# Patient Record
Sex: Female | Born: 1975
Health system: Southern US, Community
[De-identification: ages and names within clinical notes are randomized; demographics above are authoritative.]

## PROBLEM LIST (undated history)

## (undated) ENCOUNTER — Ambulatory Visit (HOSPITAL_COMMUNITY): Discharge status: Absent without leave | Payer: Medicaid Other

## (undated) ENCOUNTER — Inpatient Hospital Stay (HOSPITAL_COMMUNITY): Payer: Self-pay

## (undated) DIAGNOSIS — Z8619 Personal history of other infectious and parasitic diseases: Secondary | ICD-10-CM

## (undated) HISTORY — PX: OTHER SURGICAL HISTORY: SHX169

## (undated) HISTORY — PX: TUBAL LIGATION: SHX77

## (undated) HISTORY — DX: Personal history of other infectious and parasitic diseases: Z86.19

---

## 2005-12-07 ENCOUNTER — Ambulatory Visit: Payer: Self-pay | Admitting: Nurse Practitioner

## 2005-12-09 ENCOUNTER — Ambulatory Visit: Payer: Self-pay | Admitting: *Deleted

## 2005-12-13 ENCOUNTER — Ambulatory Visit (HOSPITAL_COMMUNITY): Admission: RE | Admit: 2005-12-13 | Discharge: 2005-12-13 | Payer: Self-pay | Admitting: Nurse Practitioner

## 2006-03-06 ENCOUNTER — Ambulatory Visit: Payer: Self-pay | Admitting: Nurse Practitioner

## 2006-05-31 ENCOUNTER — Ambulatory Visit: Payer: Self-pay | Admitting: Obstetrics and Gynecology

## 2006-11-22 ENCOUNTER — Encounter (INDEPENDENT_AMBULATORY_CARE_PROVIDER_SITE_OTHER): Payer: Self-pay | Admitting: *Deleted

## 2007-06-13 ENCOUNTER — Encounter (INDEPENDENT_AMBULATORY_CARE_PROVIDER_SITE_OTHER): Payer: Self-pay | Admitting: Nurse Practitioner

## 2007-06-13 ENCOUNTER — Ambulatory Visit: Payer: Self-pay | Admitting: Family Medicine

## 2007-06-13 LAB — CONVERTED CEMR LAB
Albumin: 4.4 g/dL (ref 3.5–5.2)
CO2: 27 meq/L (ref 19–32)
Calcium: 8.7 mg/dL (ref 8.4–10.5)
Chloride: 101 meq/L (ref 96–112)
Eosinophils Relative: 1 % (ref 0–5)
Glucose, Bld: 116 mg/dL — ABNORMAL HIGH (ref 70–99)
HCT: 42 % (ref 36.0–46.0)
Lymphocytes Relative: 36 % (ref 12–46)
Lymphs Abs: 2.3 10*3/uL (ref 0.7–4.0)
Platelets: 294 10*3/uL (ref 150–400)
Potassium: 3.8 meq/L (ref 3.5–5.3)
Sodium: 140 meq/L (ref 135–145)
TSH: 0.934 microintl units/mL (ref 0.350–5.50)
Total Bilirubin: 0.3 mg/dL (ref 0.3–1.2)
Total Protein: 8.2 g/dL (ref 6.0–8.3)
WBC: 6.5 10*3/uL (ref 4.0–10.5)

## 2007-07-03 ENCOUNTER — Ambulatory Visit: Payer: Self-pay | Admitting: Internal Medicine

## 2007-10-30 ENCOUNTER — Ambulatory Visit: Payer: Self-pay | Admitting: Internal Medicine

## 2009-04-07 ENCOUNTER — Inpatient Hospital Stay (HOSPITAL_COMMUNITY): Admission: AD | Admit: 2009-04-07 | Discharge: 2009-04-07 | Payer: Self-pay | Admitting: Obstetrics and Gynecology

## 2009-08-26 ENCOUNTER — Inpatient Hospital Stay (HOSPITAL_COMMUNITY): Admission: AD | Admit: 2009-08-26 | Discharge: 2009-08-30 | Payer: Self-pay | Admitting: Obstetrics and Gynecology

## 2009-10-01 ENCOUNTER — Inpatient Hospital Stay (HOSPITAL_COMMUNITY): Admission: RE | Admit: 2009-10-01 | Discharge: 2009-10-04 | Payer: Self-pay | Admitting: Obstetrics and Gynecology

## 2009-10-01 ENCOUNTER — Encounter (INDEPENDENT_AMBULATORY_CARE_PROVIDER_SITE_OTHER): Payer: Self-pay | Admitting: Obstetrics and Gynecology

## 2009-11-25 ENCOUNTER — Emergency Department (HOSPITAL_COMMUNITY): Admission: EM | Admit: 2009-11-25 | Discharge: 2009-11-25 | Payer: Self-pay | Admitting: Emergency Medicine

## 2010-03-27 ENCOUNTER — Encounter: Payer: Self-pay | Admitting: Nurse Practitioner

## 2010-03-28 ENCOUNTER — Encounter: Payer: Self-pay | Admitting: Obstetrics and Gynecology

## 2010-05-22 LAB — CBC
HCT: 39.5 % (ref 36.0–46.0)
MCH: 29.1 pg (ref 26.0–34.0)
MCH: 29.9 pg (ref 26.0–34.0)
MCHC: 34.1 g/dL (ref 30.0–36.0)
MCV: 86.6 fL (ref 78.0–100.0)
MCV: 87.7 fL (ref 78.0–100.0)
Platelets: 193 10*3/uL (ref 150–400)
Platelets: 264 10*3/uL (ref 150–400)
RDW: 15.6 % — ABNORMAL HIGH (ref 11.5–15.5)
RDW: 16.3 % — ABNORMAL HIGH (ref 11.5–15.5)
WBC: 7.9 10*3/uL (ref 4.0–10.5)

## 2010-05-22 LAB — BASIC METABOLIC PANEL
BUN: 4 mg/dL — ABNORMAL LOW (ref 6–23)
Calcium: 9 mg/dL (ref 8.4–10.5)
Creatinine, Ser: 0.57 mg/dL (ref 0.4–1.2)
GFR calc non Af Amer: >60 mL/min (ref >60)
Glucose, Bld: 79 mg/dL (ref 70–99)

## 2010-05-22 LAB — GLUCOSE, CAPILLARY
Glucose-Capillary: 105 mg/dL — ABNORMAL HIGH (ref 70–99)
Glucose-Capillary: 118 mg/dL — ABNORMAL HIGH (ref 70–99)
Glucose-Capillary: 119 mg/dL — ABNORMAL HIGH (ref 70–99)
Glucose-Capillary: 140 mg/dL — ABNORMAL HIGH (ref 70–99)
Glucose-Capillary: 62 mg/dL — ABNORMAL LOW (ref 70–99)
Glucose-Capillary: 64 mg/dL — ABNORMAL LOW (ref 70–99)

## 2010-05-23 LAB — GLUCOSE, CAPILLARY
Glucose-Capillary: 103 mg/dL — ABNORMAL HIGH (ref 70–99)
Glucose-Capillary: 106 mg/dL — ABNORMAL HIGH (ref 70–99)
Glucose-Capillary: 106 mg/dL — ABNORMAL HIGH (ref 70–99)
Glucose-Capillary: 112 mg/dL — ABNORMAL HIGH (ref 70–99)
Glucose-Capillary: 118 mg/dL — ABNORMAL HIGH (ref 70–99)
Glucose-Capillary: 141 mg/dL — ABNORMAL HIGH (ref 70–99)
Glucose-Capillary: 159 mg/dL — ABNORMAL HIGH (ref 70–99)
Glucose-Capillary: 166 mg/dL — ABNORMAL HIGH (ref 70–99)
Glucose-Capillary: 89 mg/dL (ref 70–99)

## 2010-05-23 LAB — COMPREHENSIVE METABOLIC PANEL
ALT: 8 U/L (ref 0–35)
AST: 19 U/L (ref 0–37)
Albumin: 2.5 g/dL — ABNORMAL LOW (ref 3.5–5.2)
CO2: 21 mEq/L (ref 19–32)
Calcium: 8.8 mg/dL (ref 8.4–10.5)
Chloride: 108 mEq/L (ref 96–112)
GFR calc Af Amer: >60 mL/min (ref >60)
GFR calc non Af Amer: >60 mL/min (ref >60)
Sodium: 135 mEq/L (ref 135–145)
Total Bilirubin: 0.4 mg/dL (ref 0.3–1.2)

## 2010-05-23 LAB — CBC
Hemoglobin: 12.3 g/dL (ref 12.0–15.0)
MCH: 29.3 pg (ref 26.0–34.0)
Platelets: 282 10*3/uL (ref 150–400)
RBC: 4.1 MIL/uL (ref 3.87–5.11)
RBC: 4.22 MIL/uL (ref 3.87–5.11)
RDW: 14.9 % (ref 11.5–15.5)

## 2010-05-23 LAB — TYPE AND SCREEN: ABO/RH(D): B POS

## 2010-05-23 LAB — ABO/RH: ABO/RH(D): B POS

## 2010-05-27 LAB — URINALYSIS, ROUTINE W REFLEX MICROSCOPIC
Protein, ur: NEGATIVE mg/dL
Urobilinogen, UA: 0.2 mg/dL (ref 0.0–1.0)

## 2010-05-27 LAB — CBC
HCT: 38.2 % (ref 36.0–46.0)
MCHC: 33.6 g/dL (ref 30.0–36.0)
Platelets: 258 10*3/uL (ref 150–400)
RDW: 13.1 % (ref 11.5–15.5)

## 2010-05-27 LAB — GC/CHLAMYDIA PROBE AMP, GENITAL: Chlamydia, DNA Probe: NEGATIVE

## 2010-05-27 LAB — POCT PREGNANCY, URINE: Preg Test, Ur: POSITIVE

## 2010-05-27 LAB — WET PREP, GENITAL: Yeast Wet Prep HPF POC: NONE SEEN

## 2010-07-11 IMAGING — US US OB LIMITED
1 series · 14 of 28 positions shown · non-contrast
Comparison: none

OBSTETRICAL ULTRASOUND:
 This ultrasound exam was performed in the [HOSPITAL] Ultrasound Department.  The OB US report was generated in the AS system, and faxed to the ordering physician.  This report is also available in [HOSPITAL]?s AccessANYware and in [REDACTED] PACS.

[Series 1: us fetal bpp add'left gest · 38 acquisitions, 14 frames shown]
[im 2/38]
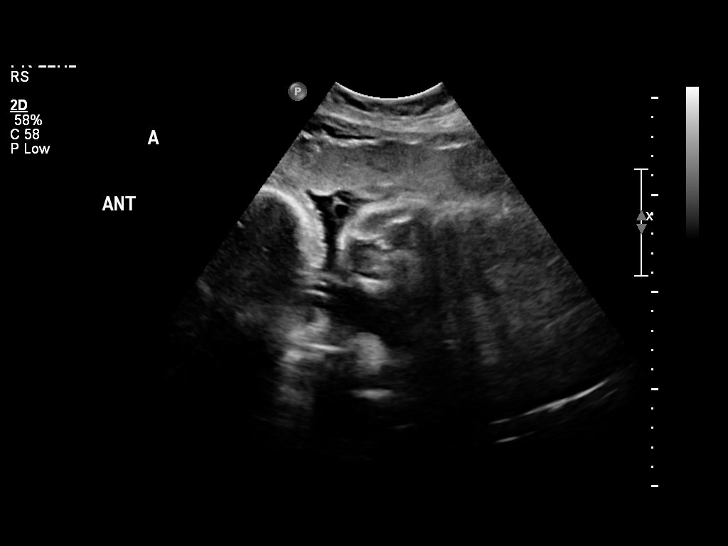
[im 5/38]
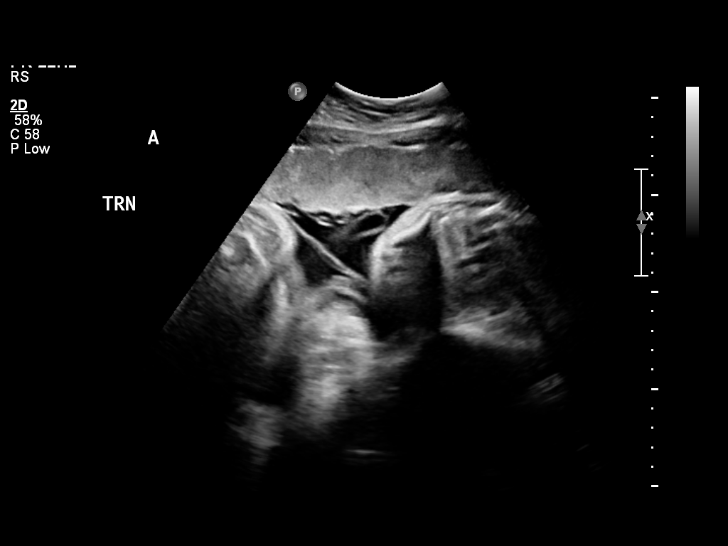
[im 7/38]
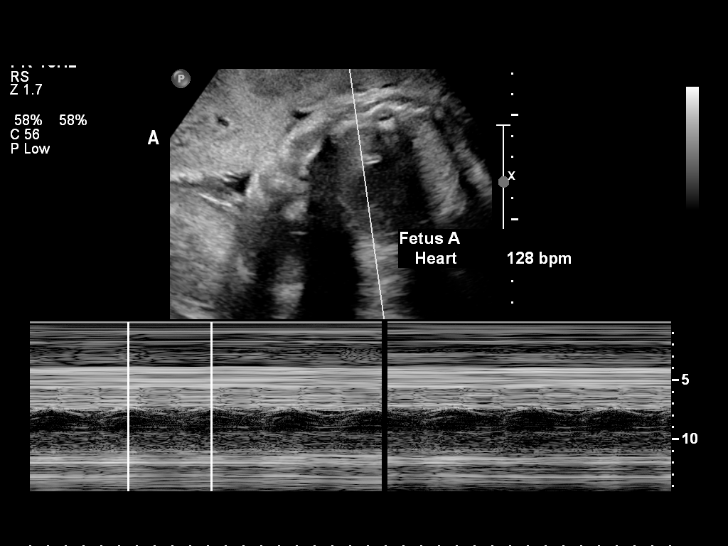
[im 10/38]
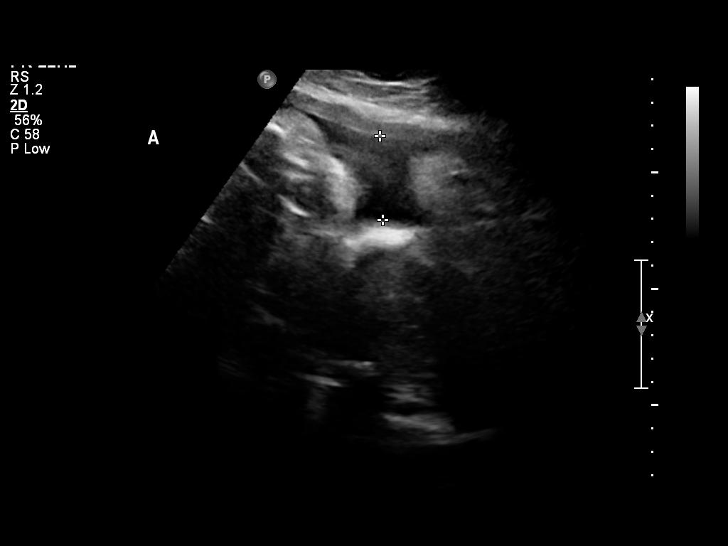
[im 13/38]
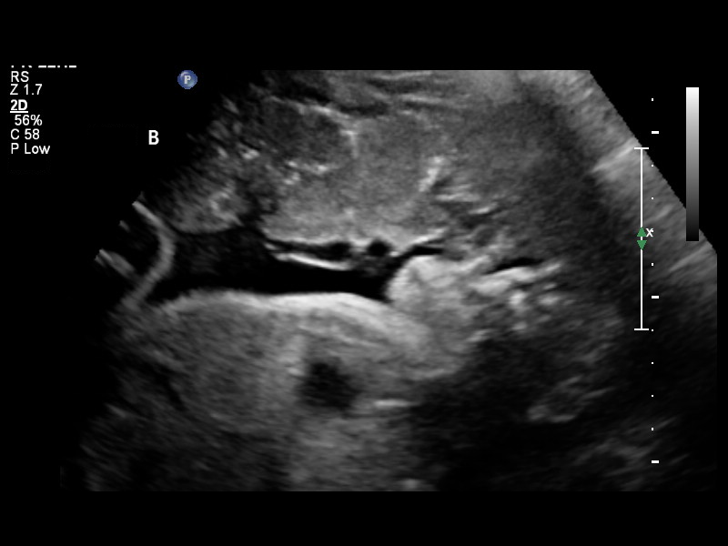
[im 16/38]
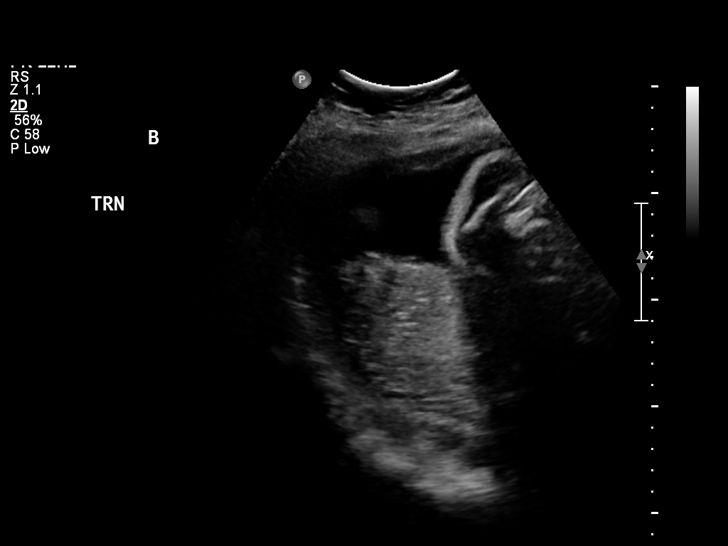
[im 18/38]
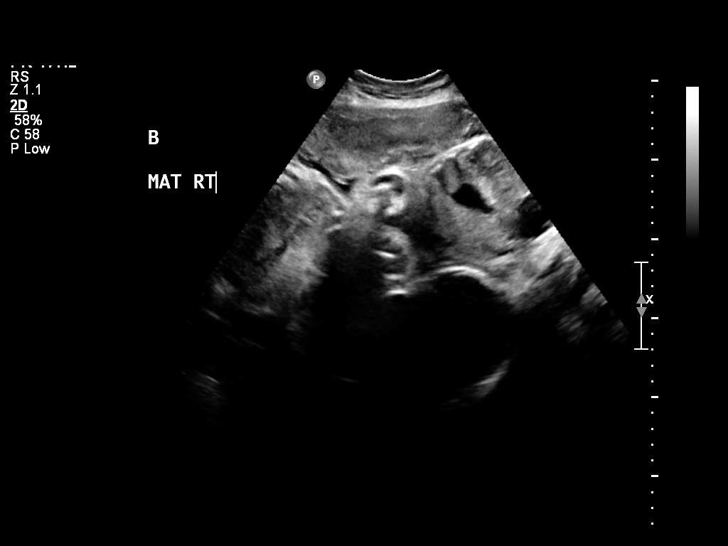
[im 21/38]
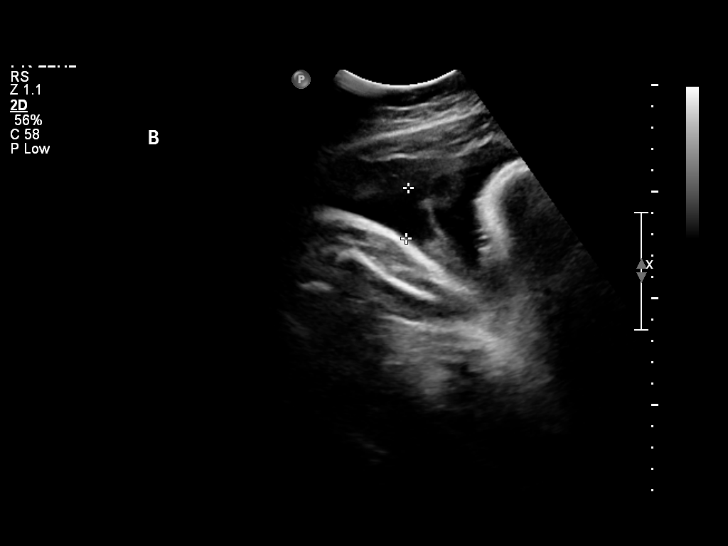
[im 24/38]
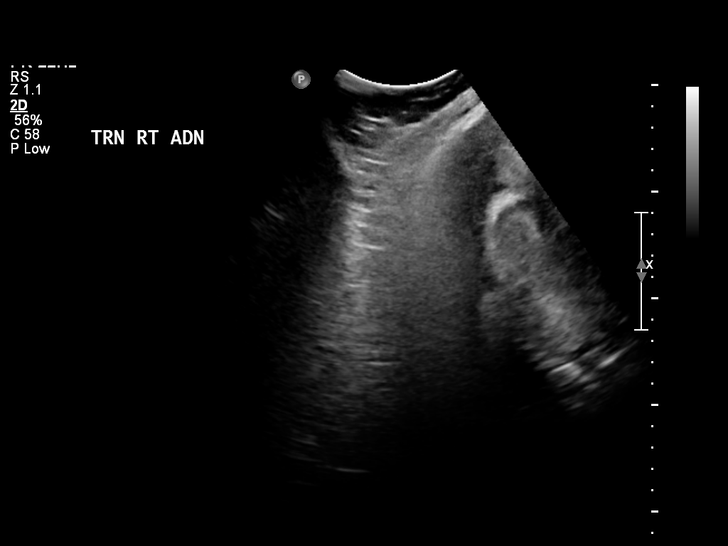
[im 27/38]
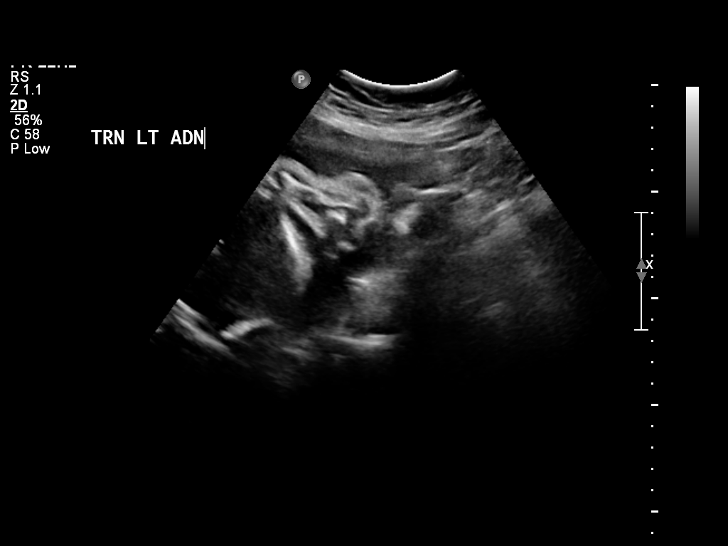
[im 29/38]
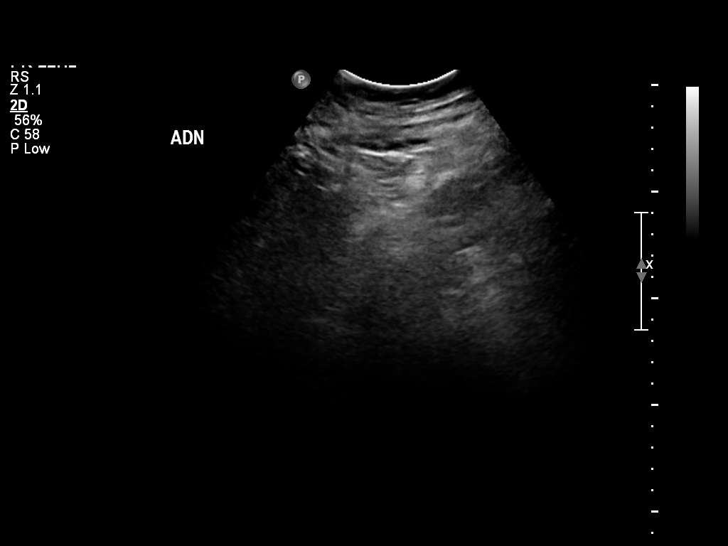
[im 32/38]
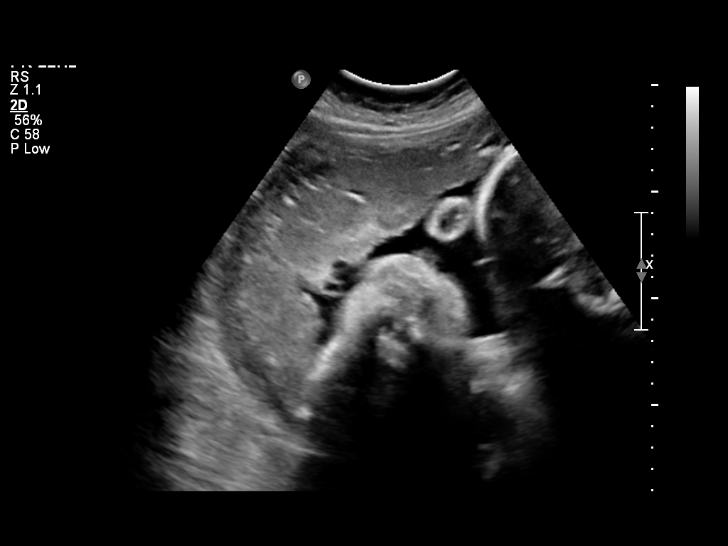
[im 35/38]
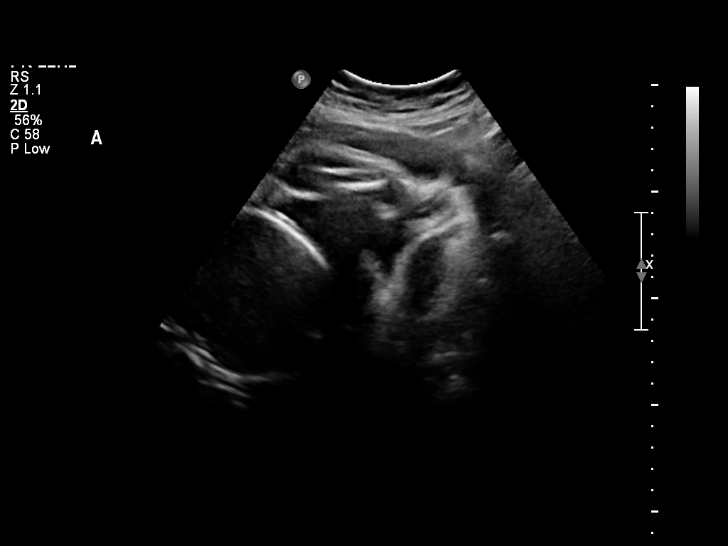
[im 38/38]
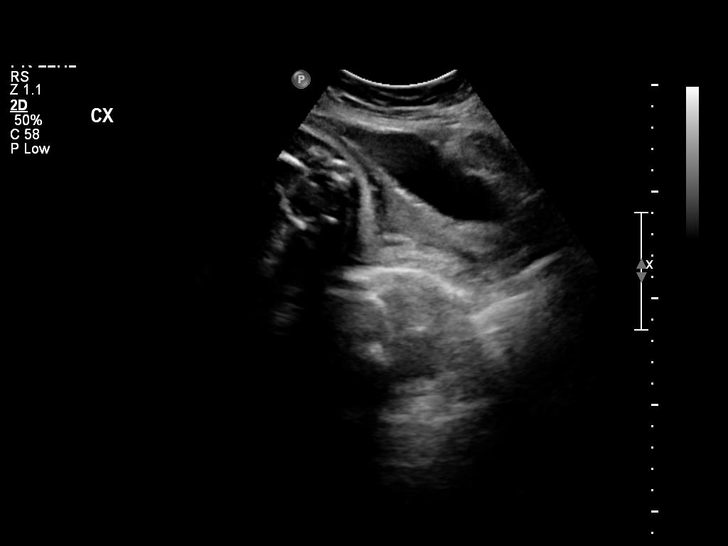

[14 of 28 positions shown; findings below may reference images not displayed]

IMPRESSION: See AS Obstetric US report.

## 2010-07-23 NOTE — Group Therapy Note (Signed)
[name], [name] NO.:  [id number]   MEDICAL RECORD NO.:  [id number]          PATIENT TYPE:  WOC   LOCATION:  [facility]                   FACILITY:  [facility]   PHYSICIAN:  Argentina Donovan, MD        DATE OF BIRTH:  [birth year]   DATE OF SERVICE:                                  CLINIC NOTE   HISTORY OF PRESENT ILLNESS:  The patient is a 35 year old Arabic-  speaking [place] female whose translation is by her husband who speaks  quite good English who comes in because of a menstrual irregularity, and  was referred by the clinic.  She is a gravida 5, para 3-0-2-3.  She had  her last baby 2 years ago, and breast fed up until approximately  September of 2007.  Since that time, she has bled on and off with the  longest time between bleeds of about 7 days.  Three months ago, she had  a pelvic ultrasound done here which was completely normal, and had exam  recently by her general practitioner who referred her over.  In  discussion with the patient, it sounds as if she probably has a hormonal  problem.  On examination, she has got a large, thickened neck where the  thyroid gland palpates and somewhat large.  She apparently had been on  thyroid medication prior to her last baby, but the doctor told her that  it had all gone back to normal.  In any case, we are going to follow  that up.  Also, having her cycle for 2 months on Loestrin FE.  A TSH, T4  and T3 have been drawn.  If the thyroid needs treatment, we will go  ahead and do that.  I have told her to return after 2 months if bleeding  is not controlled.   IMPRESSION:  1. Menometrorrhagia.  2. Enlarged thyroid.           ______________________________  Argentina Donovan, MD     PR/MEDQ  D:  05/31/2006  T:  05/31/2006  Job:  [id number]

## 2011-03-21 ENCOUNTER — Inpatient Hospital Stay (HOSPITAL_COMMUNITY)
Admission: AD | Admit: 2011-03-21 | Discharge: 2011-03-21 | Disposition: A | Discharge status: Home / Self Care | Payer: Self-pay | Source: Ambulatory Visit | Attending: Obstetrics & Gynecology | Admitting: Obstetrics & Gynecology

## 2011-03-21 ENCOUNTER — Encounter (HOSPITAL_COMMUNITY): Payer: Self-pay | Admitting: *Deleted

## 2011-03-21 DIAGNOSIS — IMO0001 Reserved for inherently not codable concepts without codable children: Secondary | ICD-10-CM

## 2011-03-21 DIAGNOSIS — R109 Unspecified abdominal pain: Secondary | ICD-10-CM | POA: Insufficient documentation

## 2011-03-21 DIAGNOSIS — Z7689 Persons encountering health services in other specified circumstances: Secondary | ICD-10-CM

## 2011-03-21 LAB — URINALYSIS, ROUTINE W REFLEX MICROSCOPIC
Glucose, UA: NEGATIVE mg/dL
Ketones, ur: NEGATIVE mg/dL
Leukocytes, UA: NEGATIVE
Specific Gravity, Urine: 1.01 (ref 1.005–1.030)
pH: 7 (ref 5.0–8.0)

## 2011-03-21 LAB — DIFFERENTIAL
Basophils Absolute: 0 10*3/uL (ref 0.0–0.1)
Basophils Relative: 0 % (ref 0–1)
Eosinophils Absolute: 0.1 10*3/uL (ref 0.0–0.7)
Monocytes Relative: 4 % (ref 3–12)
Neutro Abs: 4.2 10*3/uL (ref 1.7–7.7)
Neutrophils Relative %: 53 % (ref 43–77)

## 2011-03-21 LAB — CBC
Hemoglobin: 13.1 g/dL (ref 12.0–15.0)
MCH: 28.1 pg (ref 26.0–34.0)
MCHC: 32.1 g/dL (ref 30.0–36.0)
Platelets: 280 10*3/uL (ref 150–400)
RDW: 13.1 % (ref 11.5–15.5)

## 2011-03-21 LAB — URINE MICROSCOPIC-ADD ON

## 2011-03-21 MED ORDER — IBUPROFEN 200 MG PO TABS: 600.0000 mg | ORAL_TABLET | Freq: Four times a day (QID) | ORAL | Status: DC | PRN

## 2011-03-21 NOTE — Progress Notes (Signed)
Pt/spouse state bilateral lower abd pain since 0700 this am, has had small amount of bleeding, scant amt. Denies vaginal d/c changes. Does have burning/urgency with voiding. lmp-?beginning of December. Usually has regular monthly cycles.

## 2011-08-17 ENCOUNTER — Inpatient Hospital Stay (HOSPITAL_COMMUNITY)
Admission: AD | Admit: 2011-08-17 | Discharge: 2011-08-17 | Disposition: A | Discharge status: Home / Self Care | Payer: Medicaid Other | Source: Ambulatory Visit | Attending: Obstetrics & Gynecology | Admitting: Obstetrics & Gynecology

## 2011-08-17 ENCOUNTER — Encounter (HOSPITAL_COMMUNITY): Payer: Self-pay | Admitting: *Deleted

## 2011-08-17 DIAGNOSIS — Z3201 Encounter for pregnancy test, result positive: Secondary | ICD-10-CM | POA: Insufficient documentation

## 2011-08-17 DIAGNOSIS — N912 Amenorrhea, unspecified: Secondary | ICD-10-CM | POA: Insufficient documentation

## 2011-08-17 LAB — POCT PREGNANCY, URINE: Preg Test, Ur: POSITIVE — AB

## 2011-08-17 NOTE — MAU Note (Signed)
States no menstrual cycle x 2 months.

## 2011-08-17 NOTE — MAU Provider Note (Signed)
  History     CSN: [id number]  Arrival date and time: 08/17/11 1753   First Provider Initiated Contact with Patient 08/17/11 1920      Chief Complaint  Patient presents with  . Possible Pregnancy   HPI Briana Campbell 36 y.o. No menses x 2 months.  Pregnancy test is positive in MAU.  No pain.  NO bleeding.  No other problems.  Wanted to confirm the pregnancy.  OB History    Grav Para Term Preterm Abortions TAB SAB Ect Mult Living   6 6 5 1  0 0 0 0 2 5      Past Medical History  Diagnosis Date  . Gestational diabetes     Past Surgical History  Procedure Date  . Cesarean section     x 3    Family History  Problem Relation Age of Onset  . Anesthesia problems Neg Hx     History  Substance Use Topics  . Smoking status: Never Smoker   . Smokeless tobacco: Never Used  . Alcohol Use: No    Allergies: No Known Allergies  Prescriptions prior to admission  Medication Sig Dispense Refill  . DISCONTD: ibuprofen (ADVIL,MOTRIN) 200 MG tablet Take 3 tablets (600 mg total) by mouth every 6 (six) hours as needed for pain. Patient used this medication for a headache.  30 tablet  0    ROS Physical Exam   Blood pressure 93/55, pulse 75, temperature 98.8 F (37.1 C), temperature source Oral, resp. rate 20, height 5\' 1"  (1.549 m), weight 206 lb (93.441 kg), last menstrual period 06/06/2011.  Physical Exam  Nursing note and vitals reviewed. Constitutional: She is oriented to person, place, and time. She appears well-developed and well-nourished.  HENT:  Head: Normocephalic.  Eyes: EOM are normal.  Neck: Neck supple.  Musculoskeletal: Normal range of motion.  Neurological: She is alert and oriented to person, place, and time.  Skin: Skin is warm and dry.  Psychiatric: She has a normal mood and affect.    MAU Course  Procedures Results for orders placed during the hospital encounter of 08/17/11 (from the past 24 hour(s))  POCT PREGNANCY, URINE     Status: Abnormal   Collection Time   08/17/11  6:28 PM      Component Value Range   Preg Test, Ur POSITIVE (*) NEGATIVE     Assessment and Plan  Positive pregnancy test  Plan Begin prenatal care as soon as possible Pregnancy verification form given.  Bernell Haynie 08/17/2011, 7:25 PM

## 2011-08-17 NOTE — Discharge Instructions (Signed)
Your pregnancy test is positive.  No smoking, no drugs, no alcohol.  Take a prenatal vitamin one by mouth every day.  Eat small frequent snacks to avoid nausea.  Begin prenatal care as soon as possible. Stop taking ibuprofen. Take Tylenol 325 mg 2 tablets by mouth every 4 hours if needed for pain. Drink at least 8 8-oz glasses of water every day.   Prenatal Care [facility]    [facility]  [phone]     Phone: [phone]          [facility]                      [facility]  @[place]     Phone: [phone]  Phone: [phone]         [facility]     Phone: [phone]  Phone: [phone]           [facility] @ [place]                hone: [phone]  Phone: [phone]         [facility] [address]      Phone: [phone]  Phone: [phone]         [facility]                Phone: [phone]  [facility]   Phone:[phone]    [facility] [address] [place])          Phone: [phone] [facility]   Phone: [phone]       ________________________________________     To schedule your Maternity Eligibility Appointment, please call [phone].  When you arrive for your appointment you must bring the following items or information listed below.  Your appointment will be rescheduled if you do not have these items or are 15 minutes late. If currently receiving Medicaid, you MUST bring: 1. Medicaid Card 2. Social Security Card 3. Picture ID 4. Proof of Pregnancy 5. Verification of current address if the address on Medicaid card is incorrect "postmarked mail" If not receiving Medicaid, you MUST bring: 1. Social Security Card 2. Picture ID 3. Birth Certificate (if available) Passport or *Green Card 4. Proof of Pregnancy 5. Verification of  current address "postmarked mail" for each income presented. 6. Verification of insurance coverage, if any 7. Check stubs from each employer for the previous month (if unable to present check stub  for each week, we will accept check stub for the first and last week ill the same month.) If you can't locate check stubs, you must bring a letter from the employer(s) and it must have the following information on letterhead, typed, in English: o name of company o company telephone number o how long been with the company, if less than one month o how much person earns per hour o how many hours per week work o the gross pay the person earned for the previous month If you are 38 years old or less, you do not have to bring proof of income unless you work or live with the father of the baby and at that time we will need proof of income from you and/or the father of the baby. Green Card recipients are eligible for Medicaid for Pregnant Women (MPW)

## 2011-08-17 NOTE — MAU Note (Signed)
No cycle in 2 months. Came to see if preg. Denies pain or bleeding.

## 2011-09-18 DIAGNOSIS — Z98891 History of uterine scar from previous surgery: Secondary | ICD-10-CM | POA: Insufficient documentation

## 2011-09-21 ENCOUNTER — Encounter: Payer: Self-pay | Admitting: Obstetrics and Gynecology

## 2011-09-21 ENCOUNTER — Ambulatory Visit (INDEPENDENT_AMBULATORY_CARE_PROVIDER_SITE_OTHER): Payer: Medicaid Other | Admitting: Obstetrics and Gynecology

## 2011-09-21 VITALS — BP 90/68 | Wt 200.0 lb

## 2011-09-21 DIAGNOSIS — Z124 Encounter for screening for malignant neoplasm of cervix: Secondary | ICD-10-CM

## 2011-09-21 DIAGNOSIS — N90810 Female genital mutilation status, unspecified: Secondary | ICD-10-CM

## 2011-09-21 DIAGNOSIS — Z331 Pregnant state, incidental: Secondary | ICD-10-CM

## 2011-09-21 DIAGNOSIS — E669 Obesity, unspecified: Secondary | ICD-10-CM

## 2011-09-21 DIAGNOSIS — Z8632 Personal history of gestational diabetes: Secondary | ICD-10-CM | POA: Insufficient documentation

## 2011-09-21 DIAGNOSIS — O09529 Supervision of elderly multigravida, unspecified trimester: Secondary | ICD-10-CM | POA: Insufficient documentation

## 2011-09-21 DIAGNOSIS — Z1379 Encounter for other screening for genetic and chromosomal anomalies: Secondary | ICD-10-CM

## 2011-09-21 DIAGNOSIS — O3660X0 Maternal care for excessive fetal growth, unspecified trimester, not applicable or unspecified: Secondary | ICD-10-CM

## 2011-09-21 DIAGNOSIS — O0930 Supervision of pregnancy with insufficient antenatal care, unspecified trimester: Secondary | ICD-10-CM | POA: Insufficient documentation

## 2011-09-21 DIAGNOSIS — Z98891 History of uterine scar from previous surgery: Secondary | ICD-10-CM

## 2011-09-21 DIAGNOSIS — Z9889 Other specified postprocedural states: Secondary | ICD-10-CM

## 2011-09-21 LAB — POCT WET PREP (WET MOUNT): WBC, Wet Prep HPF POC: NEGATIVE

## 2011-09-21 LAB — POCT URINALYSIS DIPSTICK
Glucose, UA: NEGATIVE
Ketones, UA: NEGATIVE
Leukocytes, UA: NEGATIVE
Spec Grav, UA: 1.025
Urobilinogen, UA: NEGATIVE

## 2011-09-21 MED ORDER — FUSION PLUS PO CAPS: 1.0000 mg | ORAL_CAPSULE | ORAL | Status: DC

## 2011-09-21 NOTE — Progress Notes (Addendum)
Patient ID: Briana Campbell, female   DOB: 08/27/1975, 36 y.o.   MRN: [id number] Denies N,V eating all night feels good with fast per culture discussed.  [redacted]w[redacted]d  @IPILAPH @ OB History    Grav Para Term Preterm Abortions TAB SAB Ect Mult Living   7 5 5  0 1 0 1 0 2 5     Obstetric Comments   2011: GDM ON INSULIN; THIRD TRIMESTER BLEEDING HX PPD DEPRESSION X 1 MONTH WITH AFTER ONE OF ABOVE DELIVERIES     Past Medical History  Diagnosis Date  . Gestational diabetes   . Depression     PP   Past Surgical History  Procedure Date  . Female circumcision     SKIN TAG PRESENT  . Cesarean section     x 3   Family History: family history includes Asthma in her daughter and Hypertension in her mother.  There is no history of Anesthesia problems. Social History:  reports that she has never smoked. She has never used smokeless tobacco. She reports that she does not drink alcohol or use illicit drugs.    Blood pressure 90/68, weight 200 lb (90.719 kg), last menstrual period 06/06/2011.  Physical exam: Calm, no distress Alert, appropriate appearance for age. No acute distress HEENT Grossly normal Thyroid no masses, nodes or enlargement  lungs clear bilaterally, AP RRR, breasts bilaterally no masses, dimpling, drainage, abd soft, gravid, nt, bowel sounds active No edema to lower extremities EGBUS and perineum wnl, normal hair distribution Vagina pink moist Cervix LTC no cerical motion tenderness Scant white discharge appears adequate Fundal height 18 FHTS 150  Prenatal labs: ABO, Rh:   Antibody:   Rubella:   RPR:    HBsAg:    HIV:    GBS:     Assessment/Plan: [redacted]w[redacted]d S<D Gc/chl sent Wet prep: neg trich, hyphae, clue all neg Pap: to pathology [place]: f./o for dating Genetic screen: QUAD today per pt request  Needs early 1 gtt Collaboration with Dr. Normand Sloop. [facility], Briana Campbell 09/21/2011, 5:31 PM Lavera Guise, CNM

## 2011-09-21 NOTE — Progress Notes (Signed)
Pt's husband served as Equities trader. Family HX of multiple sets of twins. Is fasting for religious reasons. Is concerned about possible effects.

## 2011-09-21 NOTE — Progress Notes (Signed)
Pt unsure about genetic screening Blood work needed today  Pt needs PNV rf

## 2011-09-22 LAB — PRENATAL PANEL VII
Antibody Screen: NEGATIVE
Basophils Absolute: 0 10*3/uL (ref 0.0–0.1)
Eosinophils Relative: 1 % (ref 0–5)
Hepatitis B Surface Ag: NEGATIVE
Lymphocytes Relative: 32 % (ref 12–46)
Lymphs Abs: 2.4 10*3/uL (ref 0.7–4.0)
Neutrophils Relative %: 63 % (ref 43–77)
Platelets: 238 10*3/uL (ref 150–400)
RBC: 4.52 MIL/uL (ref 3.87–5.11)
RDW: 14.1 % (ref 11.5–15.5)
Rubella: 109.8 IU/mL — ABNORMAL HIGH
WBC: 7.6 10*3/uL (ref 4.0–10.5)

## 2011-09-23 LAB — CULTURE, OB URINE: Colony Count: 2000

## 2011-09-23 LAB — PAP IG, CT-NG, RFX HPV ASCU: GC Probe Amp: NEGATIVE

## 2011-09-26 ENCOUNTER — Other Ambulatory Visit: Payer: Self-pay | Admitting: Obstetrics and Gynecology

## 2011-09-26 ENCOUNTER — Ambulatory Visit (INDEPENDENT_AMBULATORY_CARE_PROVIDER_SITE_OTHER): Payer: Medicaid Other | Admitting: Obstetrics and Gynecology

## 2011-09-26 ENCOUNTER — Ambulatory Visit (INDEPENDENT_AMBULATORY_CARE_PROVIDER_SITE_OTHER): Payer: Medicaid Other

## 2011-09-26 ENCOUNTER — Other Ambulatory Visit: Payer: Self-pay

## 2011-09-26 VITALS — BP 90/62 | Wt 202.0 lb

## 2011-09-26 DIAGNOSIS — O3660X0 Maternal care for excessive fetal growth, unspecified trimester, not applicable or unspecified: Secondary | ICD-10-CM

## 2011-09-26 DIAGNOSIS — Z1389 Encounter for screening for other disorder: Secondary | ICD-10-CM

## 2011-09-26 DIAGNOSIS — IMO0001 Reserved for inherently not codable concepts without codable children: Secondary | ICD-10-CM

## 2011-09-26 DIAGNOSIS — O30009 Twin pregnancy, unspecified number of placenta and unspecified number of amniotic sacs, unspecified trimester: Secondary | ICD-10-CM

## 2011-09-26 DIAGNOSIS — O30809 Other specified multiple gestation, unspecified number of placenta and unspecified number of amniotic sacs, unspecified trimester: Secondary | ICD-10-CM

## 2011-09-26 LAB — US OB COMP ADDL GEST + 14 WK

## 2011-09-26 LAB — US OB COMP + 14 WK

## 2011-09-26 NOTE — Progress Notes (Addendum)
Ultrasound today twins consistent with dates cervix is 3.7 cm normal bilateral ovaries twins 8 and twin B both have normal fluid they appear to be dichorionic and diamnionic twins with a membrane that is visualized twin A appears to have an anterior placenta and twin B a posterior placenta U/S Reviewed H/o 3 csections Questions answered I rec not to fast Glucola at [place] as soon as she arrives while getting u/s Checking on genetic testing RTO 3wks for anatomy scan Declines genetic testing

## 2011-10-02 ENCOUNTER — Encounter (HOSPITAL_COMMUNITY): Payer: Self-pay | Admitting: *Deleted

## 2011-10-02 ENCOUNTER — Inpatient Hospital Stay (HOSPITAL_COMMUNITY)
Admission: AD | Admit: 2011-10-02 | Discharge: 2011-10-02 | Disposition: A | Discharge status: Home / Self Care | Payer: Medicaid Other | Source: Ambulatory Visit | Attending: Obstetrics and Gynecology | Admitting: Obstetrics and Gynecology

## 2011-10-02 DIAGNOSIS — J029 Acute pharyngitis, unspecified: Secondary | ICD-10-CM

## 2011-10-02 DIAGNOSIS — O30009 Twin pregnancy, unspecified number of placenta and unspecified number of amniotic sacs, unspecified trimester: Secondary | ICD-10-CM | POA: Insufficient documentation

## 2011-10-02 DIAGNOSIS — M542 Cervicalgia: Secondary | ICD-10-CM | POA: Insufficient documentation

## 2011-10-02 DIAGNOSIS — O99891 Other specified diseases and conditions complicating pregnancy: Secondary | ICD-10-CM | POA: Insufficient documentation

## 2011-10-02 MED ORDER — IBUPROFEN 600 MG PO TABS
600.0000 mg | ORAL_TABLET | Freq: Once | ORAL | Status: AC
  Administered 2011-10-02: 600 mg via ORAL
  Filled 2011-10-02: qty 1

## 2011-10-02 MED ORDER — ACETAMINOPHEN 325 MG PO TABS: 650.0000 mg | ORAL_TABLET | Freq: Four times a day (QID) | ORAL | Status: DC | PRN

## 2011-10-02 NOTE — Progress Notes (Signed)
Nona Smith CNM notified of pt's admission and status. Will see pt 

## 2011-10-02 NOTE — MAU Note (Signed)
Awoke Sat am and L portion of throat tender. Sharp pain when swallows but sore all the time. Alittle tender to touch.

## 2011-10-02 NOTE — MAU Provider Note (Signed)
History   CSN: [id number]  Arrival date and time: 10/02/11 0134   Pt contact 10/02/11 0245    Chief Complaint  Patient presents with  . Sore Throat   HPI Pt presents unannounced to MAU at 16w 6d  with c/o of sharp pain in the left side of her throat upon awakening this AM.  She states that the pain is increased when she swallows or coughs and that her neck is tender to touch.  Also c/o pain in the left side of her neck when she moves her head.  She states she did not have this pain last evening when she went to sleep.  She does report "allergy" symptoms such as red runny eyes and runny nose.  She denies any fever.  Her husband reports that he and one of their children also have the symptoms of "runny eyes and nose".  She denies nausea, vomiting or heartburn.  She has not treated her pain at present.  She denies any vaginal bleeding, uterine cramping or leakage of fluid.  She reports feeling some occas fetal movement.  Current pregnancy is twin gestation.  Visit interpreted by her husband.  OB History    Grav Para Term Preterm Abortions TAB SAB Ect Mult Living   7 5 5  0 1 0 1 0 2 5     Obstetric Comments   2011: GDM ON INSULIN; THIRD TRIMESTER BLEEDING HX PPD DEPRESSION X 1 MONTH WITH AFTER ONE OF ABOVE DELIVERIES      Past Medical History  Diagnosis Date  . Gestational diabetes   . Depression     PP    Past Surgical History  Procedure Date  . Female circumcision     SKIN TAG PRESENT  . Cesarean section     x 3    Family History  Problem Relation Age of Onset  . Anesthesia problems Neg Hx   . Hypertension Mother   . Asthma Daughter     History  Substance Use Topics  . Smoking status: Never Smoker   . Smokeless tobacco: Never Used  . Alcohol Use: No    Allergies: No Known Allergies  Prescriptions prior to admission  Medication Sig Dispense Refill  . loratadine (CLARITIN) 10 MG tablet Take 10 mg by mouth continuous as needed.      . Iron-FA-B  Cmp-C-Biot-Probiotic (FUSION PLUS) CAPS Take 1 mg by mouth 1 day or 1 dose.  30 capsule  11  . Prenatal Vit-Fe Fumarate-FA (PRENATAL MULTIVITAMIN) TABS Take 1 tablet by mouth daily.        Review of Systems  Constitutional: Negative.   HENT: Positive for congestion and sore throat.   Eyes: Positive for redness.  Respiratory: Negative.   Cardiovascular: Negative.   Gastrointestinal: Negative.   Genitourinary: Negative.   Musculoskeletal: Negative.   Skin: Negative.   Neurological: Negative.   Endo/Heme/Allergies: Negative.   Psychiatric/Behavioral: Negative.    Physical Exam   Blood pressure 110/64, pulse 82, temperature 98.1 F (36.7 C), resp. rate 20, height 5\' 1"  (1.549 m), weight 92.443 kg (203 lb 12.8 oz), last menstrual period 06/06/2011.  Physical Exam  Constitutional: She is oriented to person, place, and time. She appears well-developed and well-nourished.  HENT:  Head: Normocephalic and atraumatic.  Right Ear: External ear normal.  Left Ear: External ear normal.  Nose: Nose normal.  Mouth/Throat: Oropharynx is clear and moist. No oropharyngeal exudate.       TMs clear bilat.  Pharynx without redness or edema  noted.    Eyes: Pupils are equal, round, and reactive to light.       Bilateral conjunctivae with slight redness, no exudate noted.  Neck: Normal range of motion. Neck supple. No tracheal deviation present. No thyromegaly present.  Cardiovascular: Normal rate, regular rhythm and intact distal pulses.   Murmur heard.      Gr III/VI murmur rt sternal border  Respiratory: Effort normal and breath sounds normal.  GI: Soft. Bowel sounds are normal. There is no tenderness.       FHTs x 2 with doppler noted.  Genitourinary:       Deferred.    Musculoskeletal: Normal range of motion. She exhibits no edema.  Lymphadenopathy:    She has no cervical adenopathy.  Neurological: She is alert and oriented to person, place, and time. She has normal reflexes.  Skin: Skin is  warm and dry.  Psychiatric: She has a normal mood and affect. Her behavior is normal.    MAU Course  Procedures Group A rapid strep screen collected and pending  Assessment and Plan  Twin IUP at 16w 6d Pharyngitis  Motrin 600mg  given prior to discharge.  Pt to continue with Tylenol prn.  Rec warm saline gargles qid.  Rec continue use of claritin prn.   F/U [facility] on 10/18/11 as previously scheduled or if experiences fever greater than 100.5 or symptoms do not improve within next 72 hours.   Rev s/s PTL.    Carel Schnee O. 10/02/2011, 2:58 AM

## 2011-10-02 NOTE — Progress Notes (Signed)
Written and verbal d/c instructions given and understanding voiced. 

## 2011-10-04 LAB — AFP, QUAD SCREEN
Age Alone: 1:128 {titer}
Down Syndrome Scr Risk Est: 1:9610 {titer}
INH: 178.8 pg/mL
MoM for hCG: 1.84
Osb Risk: 1:1020 {titer}
Trisomy 18 (Edward) Syndrome Interp.: 1:6460 {titer}
uE3 Value: 0.5 ng/mL

## 2011-10-18 ENCOUNTER — Encounter: Payer: Self-pay | Admitting: Obstetrics and Gynecology

## 2011-10-18 ENCOUNTER — Other Ambulatory Visit: Payer: Self-pay | Admitting: Obstetrics and Gynecology

## 2011-10-18 ENCOUNTER — Ambulatory Visit (INDEPENDENT_AMBULATORY_CARE_PROVIDER_SITE_OTHER): Payer: Medicaid Other | Admitting: Obstetrics and Gynecology

## 2011-10-18 ENCOUNTER — Ambulatory Visit (INDEPENDENT_AMBULATORY_CARE_PROVIDER_SITE_OTHER): Payer: Medicaid Other

## 2011-10-18 VITALS — BP 102/62 | Wt 212.0 lb

## 2011-10-18 DIAGNOSIS — O30049 Twin pregnancy, dichorionic/diamniotic, unspecified trimester: Secondary | ICD-10-CM | POA: Insufficient documentation

## 2011-10-18 DIAGNOSIS — Z363 Encounter for antenatal screening for malformations: Secondary | ICD-10-CM

## 2011-10-18 DIAGNOSIS — Z1389 Encounter for screening for other disorder: Secondary | ICD-10-CM

## 2011-10-18 DIAGNOSIS — O30009 Twin pregnancy, unspecified number of placenta and unspecified number of amniotic sacs, unspecified trimester: Secondary | ICD-10-CM

## 2011-10-18 DIAGNOSIS — Z98891 History of uterine scar from previous surgery: Secondary | ICD-10-CM

## 2011-10-18 DIAGNOSIS — Z9889 Other specified postprocedural states: Secondary | ICD-10-CM

## 2011-10-18 DIAGNOSIS — IMO0001 Reserved for inherently not codable concepts without codable children: Secondary | ICD-10-CM

## 2011-10-18 DIAGNOSIS — Z8632 Personal history of gestational diabetes: Secondary | ICD-10-CM

## 2011-10-18 NOTE — Progress Notes (Signed)
Pt c/o lower back pain

## 2011-10-18 NOTE — Progress Notes (Signed)
Ultrasound shows: Fetus A Gestational age by US=[redacted]w[redacted]d     SIUP  S=D     [place] EDD: 03/19/12             AFI: Normal fluid(Vertical pocket=5.1cm)            Cervical length: 5.13 cm            Placenta localization: anterior           Fetal presentation: breech       Anatomy survey is normal    Anatomy complete: Fetal anatomy incomplete. Placental cord insertion not well seen            Gender : female  Ultrasound shows: Fetus B Gestational age by [place]     SIUP  S=D     [place] EDD: 03/19/12             AFI: Normal fluid (Vertical pocket=5.1cm            Cervical length: 5.13 cm            Placenta localization: posterior           Fetal presentation: breech       Anatomy survey is normal    Anatomy complete:Fetal anatomy incomplete. Placental cord insertion not well seen            Gender : female  Rec: Random glucose and HgbA1c because of suspicion of Type II DM  1hr glucola at next visit  F/U U/S in 2 wks to complete anatomy

## 2011-10-19 LAB — HEMOGLOBIN A1C
Hgb A1c MFr Bld: 5.4 % (ref <5.7)
Mean Plasma Glucose: 108 mg/dL (ref <117)

## 2011-10-19 LAB — GLUCOSE, RANDOM: Glucose, Bld: 81 mg/dL (ref 70–99)

## 2011-10-25 ENCOUNTER — Other Ambulatory Visit: Payer: Medicaid Other

## 2011-10-25 DIAGNOSIS — Z331 Pregnant state, incidental: Secondary | ICD-10-CM

## 2011-10-25 LAB — US OB FOLLOW UP

## 2011-10-25 NOTE — Progress Notes (Unsigned)
Pt is here today for a 1 GTT only. Urine in Neg/Neg. Pt given Orange Glucola . Pt drank Glucola within specified time w/ no difficulties. Pt husband is present translate both understand Glucola protocol.  Pt will have blood drawn at 3:31pm.  LC CMA

## 2011-10-26 LAB — GLUCOSE TOLERANCE, 1 HOUR (50G) W/O FASTING: Glucose, 1 Hour GTT: 101 mg/dL (ref 70–140)

## 2011-11-02 ENCOUNTER — Other Ambulatory Visit (INDEPENDENT_AMBULATORY_CARE_PROVIDER_SITE_OTHER): Payer: Medicaid Other

## 2011-11-02 ENCOUNTER — Other Ambulatory Visit: Payer: Self-pay | Admitting: Obstetrics and Gynecology

## 2011-11-02 ENCOUNTER — Ambulatory Visit (INDEPENDENT_AMBULATORY_CARE_PROVIDER_SITE_OTHER): Payer: Medicaid Other | Admitting: Obstetrics and Gynecology

## 2011-11-02 ENCOUNTER — Encounter: Payer: Self-pay | Admitting: Obstetrics and Gynecology

## 2011-11-02 VITALS — BP 100/62 | Wt 210.0 lb

## 2011-11-02 DIAGNOSIS — IMO0001 Reserved for inherently not codable concepts without codable children: Secondary | ICD-10-CM

## 2011-11-02 DIAGNOSIS — O30009 Twin pregnancy, unspecified number of placenta and unspecified number of amniotic sacs, unspecified trimester: Secondary | ICD-10-CM

## 2011-11-02 DIAGNOSIS — O26849 Uterine size-date discrepancy, unspecified trimester: Secondary | ICD-10-CM

## 2011-11-02 NOTE — Progress Notes (Signed)
[redacted]w[redacted]d The patient reports that she is doing well. Hemoglobin A1c equals 5.4.  Glucola equals 101. Patient needs note for the dentist.  Note given.  The patient requests that we write a letter asking for her mother to be allowed to come to this country.  The patient and her husband were told that a letter can be provided stating that the patient is pregnant with twins and that her due date is March 19, 2012.  The note can state that the patient requests that her mother be allowed to come to this country for support.  The patient and her husband were told that there may be an additional charge to provide non-standard notes.  The patient was told that Medicaid will not pay for non-standard notes.  Ultrasound:  Diamniotic dichorionic twins. Cervix 3.86 cm  Baby A: Vertex, normal fluid, normal anatomy, 369 g (54th percentile), anterior placenta  Baby B: Transverse, normal fluid, 366 g (53rd percentile), normal anatomy, posterior placenta  Return to office in 2 weeks. Repeat Glucola at 28 weeks. Dr. Stefano Gaul

## 2011-11-03 LAB — US OB FOLLOW UP

## 2011-11-15 ENCOUNTER — Ambulatory Visit (INDEPENDENT_AMBULATORY_CARE_PROVIDER_SITE_OTHER): Payer: Medicaid Other | Admitting: Obstetrics and Gynecology

## 2011-11-15 VITALS — BP 90/60 | Wt 213.0 lb

## 2011-11-15 DIAGNOSIS — O30049 Twin pregnancy, dichorionic/diamniotic, unspecified trimester: Secondary | ICD-10-CM

## 2011-11-15 NOTE — Progress Notes (Signed)
No complaints Doing well Requests letter for her mother RTO for u/s for EFW for twins

## 2011-11-21 ENCOUNTER — Encounter: Payer: Medicaid Other | Admitting: Obstetrics and Gynecology

## 2011-11-29 ENCOUNTER — Ambulatory Visit (INDEPENDENT_AMBULATORY_CARE_PROVIDER_SITE_OTHER): Payer: Medicaid Other | Admitting: Obstetrics and Gynecology

## 2011-11-29 ENCOUNTER — Other Ambulatory Visit: Payer: Self-pay | Admitting: Obstetrics and Gynecology

## 2011-11-29 ENCOUNTER — Ambulatory Visit (INDEPENDENT_AMBULATORY_CARE_PROVIDER_SITE_OTHER): Payer: Medicaid Other

## 2011-11-29 ENCOUNTER — Encounter: Payer: Self-pay | Admitting: Obstetrics and Gynecology

## 2011-11-29 VITALS — BP 100/60 | Wt 212.0 lb

## 2011-11-29 DIAGNOSIS — O30009 Twin pregnancy, unspecified number of placenta and unspecified number of amniotic sacs, unspecified trimester: Secondary | ICD-10-CM

## 2011-11-29 DIAGNOSIS — O30049 Twin pregnancy, dichorionic/diamniotic, unspecified trimester: Secondary | ICD-10-CM

## 2011-11-29 DIAGNOSIS — IMO0001 Reserved for inherently not codable concepts without codable children: Secondary | ICD-10-CM

## 2011-11-29 NOTE — Patient Instructions (Signed)
Fetal Movement Counts Patient Name: __________________________________________________ Patient Due Date: ____________________ Kick counts is highly recommended in high risk pregnancies, but it is a good idea for every pregnant woman to do. Start counting fetal movements at 28 weeks of the pregnancy. Fetal movements increase after eating a full meal or eating or drinking something sweet (the blood sugar is higher). It is also important to drink plenty of fluids (well hydrated) before doing the count. Lie on your left side because it helps with the circulation or you can sit in a comfortable chair with your arms over your belly (abdomen) with no distractions around you. DOING THE COUNT  Try to do the count the same time of day each time you do it.   Mark the day and time, then see how long it takes for you to feel 10 movements (kicks, flutters, swishes, rolls). You should have at least 10 movements within 2 hours. You will most likely feel 10 movements in much less than 2 hours. If you do not, wait an hour and count again. After a couple of days you will see a pattern.   What you are looking for is a change in the pattern or not enough counts in 2 hours. Is it taking longer in time to reach 10 movements?  SEEK MEDICAL CARE IF:  You feel less than 10 counts in 2 hours. Tried twice.   No movement in one hour.   The pattern is changing or taking longer each day to reach 10 counts in 2 hours.   You feel the baby is not moving as it usually does.  Date: ____________ Movements: ____________ Start time: ____________ Finish time: ____________  Date: ____________ Movements: ____________ Start time: ____________ Finish time: ____________ Date: ____________ Movements: ____________ Start time: ____________ Finish time: ____________ Date: ____________ Movements: ____________ Start time: ____________ Finish time: ____________ Date: ____________ Movements: ____________ Start time: ____________ Finish time:  ____________ Date: ____________ Movements: ____________ Start time: ____________ Finish time: ____________ Date: ____________ Movements: ____________ Start time: ____________ Finish time: ____________ Date: ____________ Movements: ____________ Start time: ____________ Finish time: ____________  Date: ____________ Movements: ____________ Start time: ____________ Finish time: ____________ Date: ____________ Movements: ____________ Start time: ____________ Finish time: ____________ Date: ____________ Movements: ____________ Start time: ____________ Finish time: ____________ Date: ____________ Movements: ____________ Start time: ____________ Finish time: ____________ Date: ____________ Movements: ____________ Start time: ____________ Finish time: ____________ Date: ____________ Movements: ____________ Start time: ____________ Finish time: ____________ Date: ____________ Movements: ____________ Start time: ____________ Finish time: ____________  Date: ____________ Movements: ____________ Start time: ____________ Finish time: ____________ Date: ____________ Movements: ____________ Start time: ____________ Finish time: ____________ Date: ____________ Movements: ____________ Start time: ____________ Finish time: ____________ Date: ____________ Movements: ____________ Start time: ____________ Finish time: ____________ Date: ____________ Movements: ____________ Start time: ____________ Finish time: ____________ Date: ____________ Movements: ____________ Start time: ____________ Finish time: ____________ Date: ____________ Movements: ____________ Start time: ____________ Finish time: ____________  Date: ____________ Movements: ____________ Start time: ____________ Finish time: ____________ Date: ____________ Movements: ____________ Start time: ____________ Finish time: ____________ Date: ____________ Movements: ____________ Start time: ____________ Finish time: ____________ Date: ____________ Movements:  ____________ Start time: ____________ Finish time: ____________ Date: ____________ Movements: ____________ Start time: ____________ Finish time: ____________ Date: ____________ Movements: ____________ Start time: ____________ Finish time: ____________ Date: ____________ Movements: ____________ Start time: ____________ Finish time: ____________  Date: ____________ Movements: ____________ Start time: ____________ Finish time: ____________ Date: ____________ Movements: ____________ Start time: ____________ Finish time: ____________ Date: ____________ Movements: ____________ Start time:   ____________ Finish time: ____________ Date: ____________ Movements: ____________ Start time: ____________ Finish time: ____________ Date: ____________ Movements: ____________ Start time: ____________ Finish time: ____________ Date: ____________ Movements: ____________ Start time: ____________ Finish time: ____________ Date: ____________ Movements: ____________ Start time: ____________ Finish time: ____________  Date: ____________ Movements: ____________ Start time: ____________ Finish time: ____________ Date: ____________ Movements: ____________ Start time: ____________ Finish time: ____________ Date: ____________ Movements: ____________ Start time: ____________ Finish time: ____________ Date: ____________ Movements: ____________ Start time: ____________ Finish time: ____________ Date: ____________ Movements: ____________ Start time: ____________ Finish time: ____________ Date: ____________ Movements: ____________ Start time: ____________ Finish time: ____________ Date: ____________ Movements: ____________ Start time: ____________ Finish time: ____________  Date: ____________ Movements: ____________ Start time: ____________ Finish time: ____________ Date: ____________ Movements: ____________ Start time: ____________ Finish time: ____________ Date: ____________ Movements: ____________ Start time: ____________ Finish  time: ____________ Date: ____________ Movements: ____________ Start time: ____________ Finish time: ____________ Date: ____________ Movements: ____________ Start time: ____________ Finish time: ____________ Date: ____________ Movements: ____________ Start time: ____________ Finish time: ____________ Date: ____________ Movements: ____________ Start time: ____________ Finish time: ____________  Date: ____________ Movements: ____________ Start time: ____________ Finish time: ____________ Date: ____________ Movements: ____________ Start time: ____________ Finish time: ____________ Date: ____________ Movements: ____________ Start time: ____________ Finish time: ____________ Date: ____________ Movements: ____________ Start time: ____________ Finish time: ____________ Date: ____________ Movements: ____________ Start time: ____________ Finish time: ____________ Date: ____________ Movements: ____________ Start time: ____________ Finish time: ____________ Document Released: 03/23/2006 Document Revised: 02/10/2011 Document Reviewed: 09/23/2008 [employer] Patient Information 2012 [employer]. 

## 2011-11-29 NOTE — Progress Notes (Signed)
[place] twin A s=d, EFW 1-8 56% Breech anterior placenta Fluid normal pocket 5.2 cm  Twin B EFW 1-8 55% TVS post placenta cx 3.53 cm glucola @[place]  Schedule cesarean at 38 weeks @[place]  A letter will be sent to [place] embassy to try to get her mom to come and help RT 2 weeks

## 2011-12-13 ENCOUNTER — Encounter: Payer: Self-pay | Admitting: Obstetrics and Gynecology

## 2011-12-13 ENCOUNTER — Ambulatory Visit (INDEPENDENT_AMBULATORY_CARE_PROVIDER_SITE_OTHER): Payer: Medicaid Other | Admitting: Obstetrics and Gynecology

## 2011-12-13 ENCOUNTER — Other Ambulatory Visit: Payer: Medicaid Other

## 2011-12-13 VITALS — BP 90/58 | Wt 216.0 lb

## 2011-12-13 DIAGNOSIS — Z331 Pregnant state, incidental: Secondary | ICD-10-CM

## 2011-12-13 DIAGNOSIS — IMO0001 Reserved for inherently not codable concepts without codable children: Secondary | ICD-10-CM

## 2011-12-13 DIAGNOSIS — O30009 Twin pregnancy, unspecified number of placenta and unspecified number of amniotic sacs, unspecified trimester: Secondary | ICD-10-CM

## 2011-12-13 NOTE — Progress Notes (Addendum)
[redacted]w[redacted]d Sudafed for sinus congestion Doing well with twins Glucola, hemoglobin, RPR today Return to office in 2 weeks Cesarean section scheduled for 38 weeks Dr. Stefano Gaul

## 2011-12-13 NOTE — Progress Notes (Signed)
Glucola given today. 

## 2011-12-14 LAB — RPR

## 2011-12-27 ENCOUNTER — Ambulatory Visit (INDEPENDENT_AMBULATORY_CARE_PROVIDER_SITE_OTHER): Payer: Medicaid Other | Admitting: Obstetrics and Gynecology

## 2011-12-27 ENCOUNTER — Encounter: Payer: Self-pay | Admitting: Obstetrics and Gynecology

## 2011-12-27 VITALS — BP 110/62 | Wt 218.0 lb

## 2011-12-27 DIAGNOSIS — Z8632 Personal history of gestational diabetes: Secondary | ICD-10-CM

## 2011-12-27 DIAGNOSIS — E669 Obesity, unspecified: Secondary | ICD-10-CM

## 2011-12-27 DIAGNOSIS — Z9889 Other specified postprocedural states: Secondary | ICD-10-CM

## 2011-12-27 DIAGNOSIS — Z98891 History of uterine scar from previous surgery: Secondary | ICD-10-CM

## 2011-12-27 DIAGNOSIS — O30049 Twin pregnancy, dichorionic/diamniotic, unspecified trimester: Secondary | ICD-10-CM

## 2011-12-27 NOTE — Progress Notes (Signed)
[redacted]w[redacted]d Wants C/S scheduled for Jan 04/2012 U/S for growth at [place] 1 hr glu nl hgb 12.6

## 2011-12-28 ENCOUNTER — Telehealth: Payer: Self-pay | Admitting: Obstetrics and Gynecology

## 2011-12-28 ENCOUNTER — Other Ambulatory Visit: Payer: Self-pay | Admitting: Obstetrics and Gynecology

## 2011-12-28 NOTE — Telephone Encounter (Signed)
Repeat C/S rescheduled to 03/08/12 @ 9:00 with [facility]. Merita Norton Pridgen

## 2011-12-30 LAB — US OB FOLLOW UP

## 2012-01-09 ENCOUNTER — Other Ambulatory Visit: Payer: Self-pay

## 2012-01-09 DIAGNOSIS — Z3689 Encounter for other specified antenatal screening: Secondary | ICD-10-CM

## 2012-01-10 ENCOUNTER — Encounter: Payer: Self-pay | Admitting: Obstetrics and Gynecology

## 2012-01-10 ENCOUNTER — Ambulatory Visit (INDEPENDENT_AMBULATORY_CARE_PROVIDER_SITE_OTHER): Payer: Medicaid Other

## 2012-01-10 ENCOUNTER — Ambulatory Visit (INDEPENDENT_AMBULATORY_CARE_PROVIDER_SITE_OTHER): Payer: Medicaid Other | Admitting: Obstetrics and Gynecology

## 2012-01-10 VITALS — BP 100/60 | Wt 223.0 lb

## 2012-01-10 DIAGNOSIS — O30049 Twin pregnancy, dichorionic/diamniotic, unspecified trimester: Secondary | ICD-10-CM

## 2012-01-10 DIAGNOSIS — Z1389 Encounter for screening for other disorder: Secondary | ICD-10-CM

## 2012-01-10 DIAGNOSIS — Z3689 Encounter for other specified antenatal screening: Secondary | ICD-10-CM

## 2012-01-10 DIAGNOSIS — Z8632 Personal history of gestational diabetes: Secondary | ICD-10-CM

## 2012-01-10 DIAGNOSIS — O30009 Twin pregnancy, unspecified number of placenta and unspecified number of amniotic sacs, unspecified trimester: Secondary | ICD-10-CM

## 2012-01-10 NOTE — Patient Instructions (Signed)
MIRENA IUD  Levonorgestrel intrauterine device (IUD) What is this medicine? LEVONORGESTREL IUD (LEE voe nor jes trel) is a contraceptive (birth control) device. It is used to prevent pregnancy and to treat heavy bleeding that occurs during your period. It can be used for up to 5 years. This medicine may be used for other purposes; ask your health care provider or pharmacist if you have questions. What should I tell my health care provider before I take this medicine? They need to know if you have any of these conditions: -abnormal Pap smear -cancer of the breast, uterus, or cervix -diabetes -endometritis -genital or pelvic infection now or in the past -have more than one sexual partner or your partner has more than one partner -heart disease -history of an ectopic or tubal pregnancy -immune system problems -IUD in place -liver disease or tumor -problems with blood clots or take blood-thinners -use intravenous drugs -uterus of unusual shape -vaginal bleeding that has not been explained -an unusual or allergic reaction to levonorgestrel, other hormones, silicone, or polyethylene, medicines, foods, dyes, or preservatives -pregnant or trying to get pregnant -breast-feeding How should I use this medicine? This device is placed inside the uterus by a health care professional. Talk to your pediatrician regarding the use of this medicine in children. Special care may be needed. Overdosage: If you think you have taken too much of this medicine contact a poison control center or emergency room at once. NOTE: This medicine is only for you. Do not share this medicine with others. What if I miss a dose? This does not apply. What may interact with this medicine? Do not take this medicine with any of the following medications: -amprenavir -bosentan -fosamprenavir This medicine may also interact with the following medications: -aprepitant -barbiturate medicines for inducing sleep or treating  seizures -bexarotene -griseofulvin -medicines to treat seizures like carbamazepine, ethotoin, felbamate, oxcarbazepine, phenytoin, topiramate -modafinil -pioglitazone -rifabutin -rifampin -rifapentine -some medicines to treat HIV infection like atazanavir, indinavir, lopinavir, nelfinavir, tipranavir, ritonavir -St. John's wort -warfarin This list may not describe all possible interactions. Give your health care provider a list of all the medicines, herbs, non-prescription drugs, or dietary supplements you use. Also tell them if you smoke, drink alcohol, or use illegal drugs. Some items may interact with your medicine. What should I watch for while using this medicine? Visit your doctor or health care professional for regular check ups. See your doctor if you or your partner has sexual contact with others, becomes HIV positive, or gets a sexual transmitted disease. This product does not protect you against HIV infection (AIDS) or other sexually transmitted diseases. You can check the placement of the IUD yourself by reaching up to the top of your vagina with clean fingers to feel the threads. Do not pull on the threads. It is a good habit to check placement after each menstrual period. Call your doctor right away if you feel more of the IUD than just the threads or if you cannot feel the threads at all. The IUD may come out by itself. You may become pregnant if the device comes out. If you notice that the IUD has come out use a backup birth control method like condoms and call your health care provider. Using tampons will not change the position of the IUD and are okay to use during your period. What side effects may I notice from receiving this medicine? Side effects that you should report to your doctor or health care professional as soon as  possible: -allergic reactions like skin rash, itching or hives, swelling of the face, lips, or tongue -fever, flu-like symptoms -genital sores -high  blood pressure -no menstrual period for 6 weeks during use -pain, swelling, warmth in the leg -pelvic pain or tenderness -severe or sudden headache -signs of pregnancy -stomach cramping -sudden shortness of breath -trouble with balance, talking, or walking -unusual vaginal bleeding, discharge -yellowing of the eyes or skin Side effects that usually do not require medical attention (report to your doctor or health care professional if they continue or are bothersome): -acne -breast pain -change in sex drive or performance -changes in weight -cramping, dizziness, or faintness while the device is being inserted -headache -irregular menstrual bleeding within first 3 to 6 months of use -nausea This list may not describe all possible side effects. Call your doctor for medical advice about side effects. You may report side effects to FDA at 1-800-FDA-1088. Where should I keep my medicine? This does not apply. NOTE: This sheet is a summary. It may not cover all possible information. If you have questions about this medicine, talk to your doctor, pharmacist, or health care provider.  2012, [employer]. (03/14/2008 6:39:08 PM)

## 2012-01-10 NOTE — Progress Notes (Signed)
[redacted]w[redacted]d  Ultrasound shows:  SIUP  S=D     [place] EDD: 03/19/2012           EFW: 3 lb 6 oz           AFI: N/A           Cervical length: 3.85 cm           Placenta localization: Twin A: Anterior, Twin B: Posterior           Fetal presentation: Twin A: Breech Maternal Right - Inferior         Twin B: Vertex Oblique- Maternal Left Superior           Anatomy survey is normal Comments:  Twin A: Breech - Maternal Right - Inferior. Anterior placenta. Normal Fluid: AP pocket = 6.1 cm Concordant Growth: EFW = 54th%tile  Twin B: Vertex Oblique - Maternal left Superior. Posterior Placenta. Normal fluid: AP Pocket = 7.1 cm Concordant growth: EFW = 66th%tile CX closed. Measured translabially.    Checklist completed

## 2012-01-17 LAB — US OB FOLLOW UP

## 2012-01-24 ENCOUNTER — Ambulatory Visit (INDEPENDENT_AMBULATORY_CARE_PROVIDER_SITE_OTHER): Payer: Medicaid Other | Admitting: Obstetrics and Gynecology

## 2012-01-24 VITALS — BP 102/60 | Wt 225.0 lb

## 2012-01-24 DIAGNOSIS — Z98891 History of uterine scar from previous surgery: Secondary | ICD-10-CM

## 2012-01-24 DIAGNOSIS — O30049 Twin pregnancy, dichorionic/diamniotic, unspecified trimester: Secondary | ICD-10-CM

## 2012-01-24 DIAGNOSIS — Z8632 Personal history of gestational diabetes: Secondary | ICD-10-CM

## 2012-01-24 DIAGNOSIS — O09529 Supervision of elderly multigravida, unspecified trimester: Secondary | ICD-10-CM

## 2012-01-24 DIAGNOSIS — Z9889 Other specified postprocedural states: Secondary | ICD-10-CM

## 2012-01-24 NOTE — Progress Notes (Signed)
[redacted]w[redacted]d  Pt has no concerns today. Feels well except fatigue F/u u/s in 2 wks for twin growth

## 2012-02-07 ENCOUNTER — Other Ambulatory Visit: Payer: Self-pay

## 2012-02-07 ENCOUNTER — Ambulatory Visit (INDEPENDENT_AMBULATORY_CARE_PROVIDER_SITE_OTHER): Payer: Medicaid Other

## 2012-02-07 ENCOUNTER — Encounter: Payer: Self-pay | Admitting: Obstetrics and Gynecology

## 2012-02-07 ENCOUNTER — Ambulatory Visit (INDEPENDENT_AMBULATORY_CARE_PROVIDER_SITE_OTHER): Payer: Medicaid Other | Admitting: Obstetrics and Gynecology

## 2012-02-07 VITALS — BP 100/70 | Wt 234.0 lb

## 2012-02-07 DIAGNOSIS — O26849 Uterine size-date discrepancy, unspecified trimester: Secondary | ICD-10-CM

## 2012-02-07 DIAGNOSIS — O30009 Twin pregnancy, unspecified number of placenta and unspecified number of amniotic sacs, unspecified trimester: Secondary | ICD-10-CM

## 2012-02-07 DIAGNOSIS — IMO0001 Reserved for inherently not codable concepts without codable children: Secondary | ICD-10-CM

## 2012-02-07 NOTE — Progress Notes (Signed)
[redacted]w[redacted]d [place] twin A                                Twin B EFW  5-11 70%                         5-10 68% cx 3.06 cm Breech                                        vtx Anterior placenta   Posterior placenta FLPK 4.1 cm                                8.1 cm RT one week for GBS Repeat growth in 2 weeks

## 2012-02-07 NOTE — Progress Notes (Signed)
Pt w/o complaint.  

## 2012-02-07 NOTE — Patient Instructions (Signed)
Fetal Movement Counts Patient Name: __________________________________________________ Patient Due Date: ____________________ Kick counts is highly recommended in high risk pregnancies, but it is a good idea for every pregnant woman to do. Start counting fetal movements at 28 weeks of the pregnancy. Fetal movements increase after eating a full meal or eating or drinking something sweet (the blood sugar is higher). It is also important to drink plenty of fluids (well hydrated) before doing the count. Lie on your left side because it helps with the circulation or you can sit in a comfortable chair with your arms over your belly (abdomen) with no distractions around you. DOING THE COUNT  Try to do the count the same time of day each time you do it.  Mark the day and time, then see how long it takes for you to feel 10 movements (kicks, flutters, swishes, rolls). You should have at least 10 movements within 2 hours. You will most likely feel 10 movements in much less than 2 hours. If you do not, wait an hour and count again. After a couple of days you will see a pattern.  What you are looking for is a change in the pattern or not enough counts in 2 hours. Is it taking longer in time to reach 10 movements? SEEK MEDICAL CARE IF:  You feel less than 10 counts in 2 hours. Tried twice.  No movement in one hour.  The pattern is changing or taking longer each day to reach 10 counts in 2 hours.  You feel the baby is not moving as it usually does. Date: ____________ Movements: ____________ Start time: ____________ Finish time: ____________  Date: ____________ Movements: ____________ Start time: ____________ Finish time: ____________ Date: ____________ Movements: ____________ Start time: ____________ Finish time: ____________ Date: ____________ Movements: ____________ Start time: ____________ Finish time: ____________ Date: ____________ Movements: ____________ Start time: ____________ Finish time:  ____________ Date: ____________ Movements: ____________ Start time: ____________ Finish time: ____________ Date: ____________ Movements: ____________ Start time: ____________ Finish time: ____________ Date: ____________ Movements: ____________ Start time: ____________ Finish time: ____________  Date: ____________ Movements: ____________ Start time: ____________ Finish time: ____________ Date: ____________ Movements: ____________ Start time: ____________ Finish time: ____________ Date: ____________ Movements: ____________ Start time: ____________ Finish time: ____________ Date: ____________ Movements: ____________ Start time: ____________ Finish time: ____________ Date: ____________ Movements: ____________ Start time: ____________ Finish time: ____________ Date: ____________ Movements: ____________ Start time: ____________ Finish time: ____________ Date: ____________ Movements: ____________ Start time: ____________ Finish time: ____________  Date: ____________ Movements: ____________ Start time: ____________ Finish time: ____________ Date: ____________ Movements: ____________ Start time: ____________ Finish time: ____________ Date: ____________ Movements: ____________ Start time: ____________ Finish time: ____________ Date: ____________ Movements: ____________ Start time: ____________ Finish time: ____________ Date: ____________ Movements: ____________ Start time: ____________ Finish time: ____________ Date: ____________ Movements: ____________ Start time: ____________ Finish time: ____________ Date: ____________ Movements: ____________ Start time: ____________ Finish time: ____________  Date: ____________ Movements: ____________ Start time: ____________ Finish time: ____________ Date: ____________ Movements: ____________ Start time: ____________ Finish time: ____________ Date: ____________ Movements: ____________ Start time: ____________ Finish time: ____________ Date: ____________ Movements:  ____________ Start time: ____________ Finish time: ____________ Date: ____________ Movements: ____________ Start time: ____________ Finish time: ____________ Date: ____________ Movements: ____________ Start time: ____________ Finish time: ____________ Date: ____________ Movements: ____________ Start time: ____________ Finish time: ____________  Date: ____________ Movements: ____________ Start time: ____________ Finish time: ____________ Date: ____________ Movements: ____________ Start time: ____________ Finish time: ____________ Date: ____________ Movements: ____________ Start time: ____________ Finish time: ____________ Date: ____________ Movements:   ____________ Start time: ____________ Finish time: ____________ Date: ____________ Movements: ____________ Start time: ____________ Finish time: ____________ Date: ____________ Movements: ____________ Start time: ____________ Finish time: ____________ Date: ____________ Movements: ____________ Start time: ____________ Finish time: ____________  Date: ____________ Movements: ____________ Start time: ____________ Finish time: ____________ Date: ____________ Movements: ____________ Start time: ____________ Finish time: ____________ Date: ____________ Movements: ____________ Start time: ____________ Finish time: ____________ Date: ____________ Movements: ____________ Start time: ____________ Finish time: ____________ Date: ____________ Movements: ____________ Start time: ____________ Finish time: ____________ Date: ____________ Movements: ____________ Start time: ____________ Finish time: ____________ Date: ____________ Movements: ____________ Start time: ____________ Finish time: ____________  Date: ____________ Movements: ____________ Start time: ____________ Finish time: ____________ Date: ____________ Movements: ____________ Start time: ____________ Finish time: ____________ Date: ____________ Movements: ____________ Start time: ____________ Finish  time: ____________ Date: ____________ Movements: ____________ Start time: ____________ Finish time: ____________ Date: ____________ Movements: ____________ Start time: ____________ Finish time: ____________ Date: ____________ Movements: ____________ Start time: ____________ Finish time: ____________ Date: ____________ Movements: ____________ Start time: ____________ Finish time: ____________  Date: ____________ Movements: ____________ Start time: ____________ Finish time: ____________ Date: ____________ Movements: ____________ Start time: ____________ Finish time: ____________ Date: ____________ Movements: ____________ Start time: ____________ Finish time: ____________ Date: ____________ Movements: ____________ Start time: ____________ Finish time: ____________ Date: ____________ Movements: ____________ Start time: ____________ Finish time: ____________ Date: ____________ Movements: ____________ Start time: ____________ Finish time: ____________ Document Released: 03/23/2006 Document Revised: 05/16/2011 Document Reviewed: 09/23/2008 [employer] Patient Information 2013 [employer].  

## 2012-02-09 ENCOUNTER — Other Ambulatory Visit: Payer: Self-pay | Admitting: Obstetrics and Gynecology

## 2012-02-09 DIAGNOSIS — O26849 Uterine size-date discrepancy, unspecified trimester: Secondary | ICD-10-CM

## 2012-02-09 LAB — US OB FOLLOW UP

## 2012-02-16 ENCOUNTER — Encounter: Payer: Self-pay | Admitting: Obstetrics and Gynecology

## 2012-02-16 ENCOUNTER — Ambulatory Visit (INDEPENDENT_AMBULATORY_CARE_PROVIDER_SITE_OTHER): Payer: Medicaid Other | Admitting: Obstetrics and Gynecology

## 2012-02-16 VITALS — BP 100/70 | Wt 235.0 lb

## 2012-02-16 DIAGNOSIS — IMO0001 Reserved for inherently not codable concepts without codable children: Secondary | ICD-10-CM

## 2012-02-16 DIAGNOSIS — Z23 Encounter for immunization: Secondary | ICD-10-CM

## 2012-02-16 DIAGNOSIS — Z331 Pregnant state, incidental: Secondary | ICD-10-CM

## 2012-02-16 DIAGNOSIS — O30009 Twin pregnancy, unspecified number of placenta and unspecified number of amniotic sacs, unspecified trimester: Secondary | ICD-10-CM

## 2012-02-16 NOTE — Progress Notes (Signed)
[redacted]w[redacted]d Flu shot given today w/o difficulty  Pt declined water to assist her with voiding.  No complaints today

## 2012-02-16 NOTE — Patient Instructions (Signed)
Cesarean Delivery  Cesarean delivery is the birth of a baby through a cut (incision) in the abdomen and womb (uterus).  LET YOUR CAREGIVER KNOW ABOUT:  Complicationsinvolving the pregnancy.  Allergies.  Medicines taken including herbs, eyedrops, over-the-counter medicines, and creams.  Use of steroids (by mouth or creams).  Previous problems with anesthetics or numbing medicine.  Previous surgery.  History of blood clots.  History of bleeding or blood problems.  Other health problems. RISKS AND COMPLICATIONS   Bleeding.  Infection.  Blood clots.  Injury to surrounding organs.  Anesthesia problems.  Injury to the baby. BEFORE THE PROCEDURE   A tube (Foley catheter) will be placed in your bladder. The Foley catheter drains the urine from your bladder into a bag. This keeps your bladder empty during surgery.  An intravenous access tube (IV) will be placed in your arm.  Hair may be removed from your pubic area and your lower abdomen. This is to prevent infection in the incision site.  You may be given an antacid medicine to drink. This will prevent acid contents in your stomach from going into your lungs if you vomit during the surgery.  You may be given an antibiotic medicine to prevent infection. PROCEDURE   You may be given medicine to numb the lower half of your body (regional anesthetic). If you were in labor, you may have already had an epidural in place which can be used in both labor and cesarean delivery. You may possibly be given medicine to make you sleep (general anesthetic) though this is not as common.  An incision will be made in your abdomen that extends to your uterus. There are 2 basic kinds of incisions:  The horizontal (transverse) incision. Horizontal incisions are used for most routine cesarean deliveries.  The vertical (up and down) incision. This is less commonly used. This is most often reserved for women who have a serious complication  (extreme prematurity) or under emergency situations.  The horizontal and vertical incisions may both be used at the same time. However, this is very uncommon.  Your baby will then be delivered. AFTER THE PROCEDURE   If you were awake during the surgery, you will see your baby right away. If you were asleep, you will see your baby as soon as you are awake.  You may breastfeed your baby after surgery.  You may be able to get up and walk the same day as the surgery. If you need to stay in bed for a period of time, you will receive help to turn, cough, and take deep breaths after surgery. This helps prevent lung problems such as pneumonia.  Do not get out of bed alone the first time after surgery. You will need help getting out of bed until you are able to do this by yourself.  You may be able to shower the day after your cesarean delivery. After the bandage (dressing) is taken off the incision site, a nurse will assist you to shower, if you like.  You will have pneumatic compressing hose placed on your feet or lower legs. These hose are used to prevent blood clots. When you are up and walking regularly, they will no longer be necessary.  Do not cross your legs when you sit.  Save any blood clots that you pass. If you pass a clot while on the toilet, do not flush it. Call for the nurse. Tell the nurse if you think you are bleeding too much or passing too many   clots.  Start drinking liquids and eating food as directed by your caregiver. If your stomach is not ready, drinking and eating too soon can cause an increase in bloating and swelling of your intestine and abdomen. This is very uncomfortable.  You will be given medicine as needed. Let your caregivers know if you are hurting. They want you to be comfortable. You may also be given an antibiotic to prevent an infection.  Your IV will be taken out when you are drinking a reasonable amount of fluids. The Foley catheter is taken out when  you are up and walking.  If your blood type is Rh negative and your baby's blood type is Rh positive, you will be given a shot of anti-D immune globulin. This shot prevents you from having Rh problems with a future pregnancy. You should get the shot even if you had your tubes tied (tubal ligation).  If you are allowed to take the baby for a walk, place the baby in the bassinet and push it. Do not carry your baby in your arms. Document Released: 02/21/2005 Document Revised: 05/16/2011 Document Reviewed: 06/18/2010 Exit[employer] Patient Information 2013 [employer].  

## 2012-02-16 NOTE — Progress Notes (Signed)
[redacted]w[redacted]d A/P GBS @[facility]  Fetal kick counts reviewed Labor reviewed with pt All patients  questions answered Pt is miserable bc the twins are so heavy.  I will consult with MFM

## 2012-02-17 ENCOUNTER — Encounter: Payer: Self-pay | Admitting: Obstetrics and Gynecology

## 2012-02-17 NOTE — Progress Notes (Signed)
Patient ID: Briana Campbell, female   DOB: 1976/01/06, 36 y.o.   MRN: [id number] I spoke with MFM.  They see no reason for delivery earlier than 38 weeks Please refer pt to [facility] at [facility] and recommend pt to use an abdominal binder for her discomfort

## 2012-02-21 ENCOUNTER — Telehealth: Payer: Self-pay

## 2012-02-23 ENCOUNTER — Ambulatory Visit (INDEPENDENT_AMBULATORY_CARE_PROVIDER_SITE_OTHER): Payer: Medicaid Other | Admitting: Obstetrics and Gynecology

## 2012-02-23 ENCOUNTER — Ambulatory Visit (INDEPENDENT_AMBULATORY_CARE_PROVIDER_SITE_OTHER): Payer: Medicaid Other

## 2012-02-23 VITALS — BP 100/60 | Wt 237.0 lb

## 2012-02-23 DIAGNOSIS — O30009 Twin pregnancy, unspecified number of placenta and unspecified number of amniotic sacs, unspecified trimester: Secondary | ICD-10-CM

## 2012-02-23 DIAGNOSIS — O26849 Uterine size-date discrepancy, unspecified trimester: Secondary | ICD-10-CM

## 2012-02-23 DIAGNOSIS — Z331 Pregnant state, incidental: Secondary | ICD-10-CM

## 2012-02-23 NOTE — Progress Notes (Signed)
  Ultrasound shows:  EFW   A 6lb 10oz  60.3%      B 7lb 1 oz 77.4%        [place] EDD: A 03/15/2012 B 03/08/2012                                Placenta localization:  A: anterior  B:Posterior            Fetal presentation:A: Infertior Vertex  B Superior Vertex    Anatomy survey completed Yes    Anatomy survey is normal                Comments: Fetus A :Fluid is normal ( vertical pocket = 6.9 CM) Membrane seen           Fetus B: fluid is Normal (vertical Pocket = 7.3 CM) Normal [place]          Pt anxiously awaiting delivery.  She is reminded that she can have her C/S on 03/05/12 with Dr.AVS, or on 03/06/12 with Dr. Alinda Sierras     She currently declines earlier C/S. She and husband state she wants no more pregnancies and wants a tubal ligation at the time of her C/S She will sign consent papers for tubal at checkout today.

## 2012-02-25 LAB — STREP B DNA PROBE: GBSP: NEGATIVE

## 2012-02-26 ENCOUNTER — Encounter (HOSPITAL_COMMUNITY): Payer: Self-pay | Admitting: Pharmacist

## 2012-03-02 ENCOUNTER — Ambulatory Visit (INDEPENDENT_AMBULATORY_CARE_PROVIDER_SITE_OTHER): Payer: Medicaid Other | Admitting: Obstetrics and Gynecology

## 2012-03-02 VITALS — BP 110/72 | Wt 240.0 lb

## 2012-03-02 DIAGNOSIS — O30049 Twin pregnancy, dichorionic/diamniotic, unspecified trimester: Secondary | ICD-10-CM

## 2012-03-02 NOTE — Progress Notes (Signed)
Patient ID: Briana Campbell, female   DOB: [birth year], 36 y.o.   MRN: [id number] [redacted]w[redacted]d Denies uc, srom, vag bleeding, declines vag exam. Has C/S and BTL for 2/02 Reviewed s/s uc, srom, vag bleeding, pain to abdomen to report, daily fetal kick counts to report, comfort measures. Encouraged 8 water daily and frequent voids. Lavera Guise, CNM

## 2012-03-02 NOTE — Progress Notes (Signed)
[redacted]w[redacted]d  C/O: pain , tightness, pressure Pt desires cervix check.

## 2012-03-05 ENCOUNTER — Encounter (HOSPITAL_COMMUNITY): Payer: Self-pay

## 2012-03-05 ENCOUNTER — Encounter (HOSPITAL_COMMUNITY)
Admission: RE | Admit: 2012-03-05 | Discharge: 2012-03-05 | Disposition: A | Discharge status: Home / Self Care | Payer: Medicaid Other | Source: Ambulatory Visit | Attending: Obstetrics and Gynecology | Admitting: Obstetrics and Gynecology

## 2012-03-05 LAB — CBC
Platelets: 209 10*3/uL (ref 150–400)
RBC: 4.63 MIL/uL (ref 3.87–5.11)
RDW: 13.8 % (ref 11.5–15.5)
WBC: 6.8 10*3/uL (ref 4.0–10.5)

## 2012-03-05 LAB — TYPE AND SCREEN

## 2012-03-05 LAB — SURGICAL PCR SCREEN
MRSA, PCR: NEGATIVE
Staphylococcus aureus: NEGATIVE

## 2012-03-05 NOTE — Patient Instructions (Addendum)
20 Bisma Vandenboom  03/05/2012   Your procedure is scheduled on:  03/08/12  Enter through the Main Entrance of [facility] at 730 AM.  Pick up the phone at the desk and dial [phone].   Call this number if you have problems the morning of surgery: [phone]   Remember:   Do not eat food:After Midnight.  Do not drink clear liquids: After Midnight.  Take these medicines the morning of surgery with A SIP OF WATER: NA   Do not wear jewelry, make-up or nail polish.  Do not wear lotions, powders, or perfumes. You may wear deodorant.  Do not shave 48 hours prior to surgery.  Do not bring valuables to the hospital.  Contacts, dentures or bridgework may not be worn into surgery.  Leave suitcase in the car. After surgery it may be brought to your room.  For patients admitted to the hospital, checkout time is 11:00 AM the day of discharge.   Patients discharged the day of surgery will not be allowed to drive home.  Name and phone number of your driver: NA  Special Instructions: Shower using CHG 2 nights before surgery and the night before surgery.  If you shower the day of surgery use CHG.  Use special wash - you have one bottle of CHG for all showers.  You should use approximately 1/3 of the bottle for each shower.   Please read over the following fact sheets that you were given: MRSA Information

## 2012-03-06 LAB — US OB FOLLOW UP

## 2012-03-06 LAB — RPR: RPR Ser Ql: NONREACTIVE

## 2012-03-08 ENCOUNTER — Inpatient Hospital Stay (HOSPITAL_COMMUNITY)
Admission: RE | Admit: 2012-03-08 | Discharge: 2012-03-10 | DRG: 766 | Disposition: A | Discharge status: Home / Self Care | Payer: Medicaid Other | Source: Ambulatory Visit | Attending: Obstetrics and Gynecology | Admitting: Obstetrics and Gynecology

## 2012-03-08 ENCOUNTER — Inpatient Hospital Stay (HOSPITAL_COMMUNITY): Payer: Medicaid Other | Admitting: Anesthesiology

## 2012-03-08 ENCOUNTER — Encounter (HOSPITAL_COMMUNITY): Payer: Self-pay | Admitting: Anesthesiology

## 2012-03-08 ENCOUNTER — Encounter (HOSPITAL_COMMUNITY)
Admission: RE | Disposition: A | Discharge status: Home / Self Care | Payer: Self-pay | Source: Ambulatory Visit | Attending: Obstetrics and Gynecology

## 2012-03-08 ENCOUNTER — Encounter (HOSPITAL_COMMUNITY): Payer: Self-pay | Admitting: *Deleted

## 2012-03-08 DIAGNOSIS — Z302 Encounter for sterilization: Secondary | ICD-10-CM

## 2012-03-08 DIAGNOSIS — Z01818 Encounter for other preprocedural examination: Secondary | ICD-10-CM

## 2012-03-08 DIAGNOSIS — Z98891 History of uterine scar from previous surgery: Secondary | ICD-10-CM | POA: Diagnosis present

## 2012-03-08 DIAGNOSIS — O34219 Maternal care for unspecified type scar from previous cesarean delivery: Secondary | ICD-10-CM

## 2012-03-08 DIAGNOSIS — O30009 Twin pregnancy, unspecified number of placenta and unspecified number of amniotic sacs, unspecified trimester: Principal | ICD-10-CM | POA: Diagnosis present

## 2012-03-08 DIAGNOSIS — Z01812 Encounter for preprocedural laboratory examination: Secondary | ICD-10-CM

## 2012-03-08 DIAGNOSIS — O09529 Supervision of elderly multigravida, unspecified trimester: Secondary | ICD-10-CM | POA: Diagnosis present

## 2012-03-08 DIAGNOSIS — D649 Anemia, unspecified: Secondary | ICD-10-CM | POA: Diagnosis not present

## 2012-03-08 DIAGNOSIS — O9903 Anemia complicating the puerperium: Secondary | ICD-10-CM | POA: Diagnosis not present

## 2012-03-08 DIAGNOSIS — O30049 Twin pregnancy, dichorionic/diamniotic, unspecified trimester: Secondary | ICD-10-CM

## 2012-03-08 SURGERY — Surgical Case
Anesthesia: Spinal | Site: Abdomen | Wound class: Clean Contaminated

## 2012-03-08 MED ORDER — BUPIVACAINE IN DEXTROSE 0.75-8.25 % IT SOLN
INTRATHECAL | Status: AC
  Filled 2012-03-08: qty 2

## 2012-03-08 MED ORDER — ONDANSETRON HCL 4 MG/2ML IJ SOLN: 4.0000 mg | INTRAMUSCULAR | Status: DC | PRN

## 2012-03-08 MED ORDER — NALOXONE HCL 0.4 MG/ML IJ SOLN: 0.4000 mg | INTRAMUSCULAR | Status: DC | PRN

## 2012-03-08 MED ORDER — OXYTOCIN 10 UNIT/ML IJ SOLN
40.0000 [IU] | INTRAVENOUS | Status: DC | PRN
  Administered 2012-03-08: 40 [IU] via INTRAVENOUS

## 2012-03-08 MED ORDER — ONDANSETRON HCL 4 MG/2ML IJ SOLN: 4.0000 mg | Freq: Three times a day (TID) | INTRAMUSCULAR | Status: DC | PRN

## 2012-03-08 MED ORDER — NALBUPHINE HCL 10 MG/ML IJ SOLN
5.0000 mg | INTRAMUSCULAR | Status: DC | PRN
  Filled 2012-03-08: qty 1

## 2012-03-08 MED ORDER — MENTHOL 3 MG MT LOZG: 1.0000 | LOZENGE | OROMUCOSAL | Status: DC | PRN

## 2012-03-08 MED ORDER — FENTANYL CITRATE 0.05 MG/ML IJ SOLN
INTRAMUSCULAR | Status: AC
  Filled 2012-03-08: qty 4

## 2012-03-08 MED ORDER — NALOXONE HCL 1 MG/ML IJ SOLN
1.0000 ug/kg/h | INTRAVENOUS | Status: DC | PRN
  Filled 2012-03-08: qty 2

## 2012-03-08 MED ORDER — PRENATAL MULTIVITAMIN CH
1.0000 | ORAL_TABLET | Freq: Every day | ORAL | Status: DC
  Administered 2012-03-10: 1 via ORAL
  Filled 2012-03-08: qty 1

## 2012-03-08 MED ORDER — ZOLPIDEM TARTRATE 5 MG PO TABS: 5.0000 mg | ORAL_TABLET | Freq: Every evening | ORAL | Status: DC | PRN

## 2012-03-08 MED ORDER — OXYTOCIN 40 UNITS IN LACTATED RINGERS INFUSION - SIMPLE MED: 62.5000 mL/h | INTRAVENOUS | Status: AC

## 2012-03-08 MED ORDER — KETOROLAC TROMETHAMINE 30 MG/ML IJ SOLN: 30.0000 mg | Freq: Four times a day (QID) | INTRAMUSCULAR | Status: AC | PRN

## 2012-03-08 MED ORDER — IBUPROFEN 600 MG PO TABS
600.0000 mg | ORAL_TABLET | Freq: Four times a day (QID) | ORAL | Status: DC | PRN
  Filled 2012-03-08 (×4): qty 1

## 2012-03-08 MED ORDER — LANOLIN HYDROUS EX OINT: 1.0000 "application " | TOPICAL_OINTMENT | CUTANEOUS | Status: DC | PRN

## 2012-03-08 MED ORDER — PHENYLEPHRINE 40 MCG/ML (10ML) SYRINGE FOR IV PUSH (FOR BLOOD PRESSURE SUPPORT)
PREFILLED_SYRINGE | INTRAVENOUS | Status: AC
  Filled 2012-03-08: qty 5

## 2012-03-08 MED ORDER — CEFAZOLIN SODIUM-DEXTROSE 2-3 GM-% IV SOLR
2.0000 g | INTRAVENOUS | Status: AC
  Administered 2012-03-08: 2 g via INTRAVENOUS

## 2012-03-08 MED ORDER — KETOROLAC TROMETHAMINE 30 MG/ML IJ SOLN
30.0000 mg | Freq: Four times a day (QID) | INTRAMUSCULAR | Status: AC | PRN
  Administered 2012-03-08 (×2): 30 mg via INTRAVENOUS
  Filled 2012-03-08: qty 1

## 2012-03-08 MED ORDER — SODIUM CHLORIDE 0.9 % IJ SOLN: 3.0000 mL | INTRAMUSCULAR | Status: DC | PRN

## 2012-03-08 MED ORDER — LACTATED RINGERS IV SOLN
INTRAVENOUS | Status: DC
  Administered 2012-03-08: 19:00:00 via INTRAVENOUS

## 2012-03-08 MED ORDER — DIPHENHYDRAMINE HCL 50 MG/ML IJ SOLN: 12.5000 mg | INTRAMUSCULAR | Status: DC | PRN

## 2012-03-08 MED ORDER — KETOROLAC TROMETHAMINE 30 MG/ML IJ SOLN
INTRAMUSCULAR | Status: AC
  Administered 2012-03-08: 30 mg via INTRAVENOUS
  Filled 2012-03-08: qty 1

## 2012-03-08 MED ORDER — OXYTOCIN 10 UNIT/ML IJ SOLN
INTRAMUSCULAR | Status: AC
  Filled 2012-03-08: qty 4

## 2012-03-08 MED ORDER — MEPERIDINE HCL 25 MG/ML IJ SOLN: 6.2500 mg | INTRAMUSCULAR | Status: DC | PRN

## 2012-03-08 MED ORDER — EPHEDRINE SULFATE 50 MG/ML IJ SOLN
INTRAMUSCULAR | Status: DC | PRN
  Administered 2012-03-08 (×2): 10 mg via INTRAVENOUS

## 2012-03-08 MED ORDER — BUPIVACAINE HCL (PF) 0.25 % IJ SOLN
INTRAMUSCULAR | Status: AC
  Filled 2012-03-08: qty 30

## 2012-03-08 MED ORDER — DIPHENHYDRAMINE HCL 25 MG PO CAPS: 25.0000 mg | ORAL_CAPSULE | Freq: Four times a day (QID) | ORAL | Status: DC | PRN

## 2012-03-08 MED ORDER — LACTATED RINGERS IV SOLN
INTRAVENOUS | Status: DC | PRN
  Administered 2012-03-08 (×3): via INTRAVENOUS

## 2012-03-08 MED ORDER — WITCH HAZEL-GLYCERIN EX PADS: 1.0000 "application " | MEDICATED_PAD | CUTANEOUS | Status: DC | PRN

## 2012-03-08 MED ORDER — PHENYLEPHRINE HCL 10 MG/ML IJ SOLN
INTRAMUSCULAR | Status: DC | PRN
  Administered 2012-03-08: 80 ug via INTRAVENOUS
  Administered 2012-03-08 (×2): 40 ug via INTRAVENOUS
  Administered 2012-03-08: 80 ug via INTRAVENOUS
  Administered 2012-03-08: 40 ug via INTRAVENOUS
  Administered 2012-03-08 (×4): 80 ug via INTRAVENOUS
  Administered 2012-03-08: 120 ug via INTRAVENOUS

## 2012-03-08 MED ORDER — LACTATED RINGERS IV SOLN
INTRAVENOUS | Status: DC
  Administered 2012-03-08: 08:00:00 via INTRAVENOUS

## 2012-03-08 MED ORDER — ONDANSETRON HCL 4 MG PO TABS: 4.0000 mg | ORAL_TABLET | ORAL | Status: DC | PRN

## 2012-03-08 MED ORDER — SCOPOLAMINE 1 MG/3DAYS TD PT72
1.0000 | MEDICATED_PATCH | Freq: Once | TRANSDERMAL | Status: DC
  Administered 2012-03-08: 1.5 mg via TRANSDERMAL

## 2012-03-08 MED ORDER — FENTANYL CITRATE 0.05 MG/ML IJ SOLN
INTRAMUSCULAR | Status: AC
  Administered 2012-03-08: 50 ug via INTRAVENOUS
  Filled 2012-03-08: qty 2

## 2012-03-08 MED ORDER — DIPHENHYDRAMINE HCL 25 MG PO CAPS: 25.0000 mg | ORAL_CAPSULE | ORAL | Status: DC | PRN

## 2012-03-08 MED ORDER — SIMETHICONE 80 MG PO CHEW
80.0000 mg | CHEWABLE_TABLET | Freq: Three times a day (TID) | ORAL | Status: DC
  Administered 2012-03-08 – 2012-03-10 (×6): 80 mg via ORAL

## 2012-03-08 MED ORDER — METOCLOPRAMIDE HCL 5 MG/ML IJ SOLN: 10.0000 mg | Freq: Three times a day (TID) | INTRAMUSCULAR | Status: DC | PRN

## 2012-03-08 MED ORDER — SIMETHICONE 80 MG PO CHEW: 80.0000 mg | CHEWABLE_TABLET | ORAL | Status: DC | PRN

## 2012-03-08 MED ORDER — FENTANYL CITRATE 0.05 MG/ML IJ SOLN
25.0000 ug | INTRAMUSCULAR | Status: DC | PRN
  Administered 2012-03-08: 50 ug via INTRAVENOUS

## 2012-03-08 MED ORDER — SENNOSIDES-DOCUSATE SODIUM 8.6-50 MG PO TABS
2.0000 | ORAL_TABLET | Freq: Every day | ORAL | Status: DC
  Administered 2012-03-08 – 2012-03-09 (×2): 2 via ORAL

## 2012-03-08 MED ORDER — DIBUCAINE 1 % RE OINT: 1.0000 "application " | TOPICAL_OINTMENT | RECTAL | Status: DC | PRN

## 2012-03-08 MED ORDER — ONDANSETRON HCL 4 MG/2ML IJ SOLN
INTRAMUSCULAR | Status: AC
  Filled 2012-03-08: qty 2

## 2012-03-08 MED ORDER — DIPHENHYDRAMINE HCL 50 MG/ML IJ SOLN: 25.0000 mg | INTRAMUSCULAR | Status: DC | PRN

## 2012-03-08 MED ORDER — MORPHINE SULFATE (PF) 0.5 MG/ML IJ SOLN
INTRAMUSCULAR | Status: DC | PRN
  Administered 2012-03-08: .5 mg via INTRATHECAL

## 2012-03-08 MED ORDER — MORPHINE SULFATE 0.5 MG/ML IJ SOLN
INTRAMUSCULAR | Status: AC
  Filled 2012-03-08: qty 10

## 2012-03-08 MED ORDER — FENTANYL CITRATE 0.05 MG/ML IJ SOLN
INTRAMUSCULAR | Status: DC | PRN
  Administered 2012-03-08: 25 ug via INTRATHECAL

## 2012-03-08 MED ORDER — TETANUS-DIPHTH-ACELL PERTUSSIS 5-2.5-18.5 LF-MCG/0.5 IM SUSP: 0.5000 mL | Freq: Once | INTRAMUSCULAR | Status: DC

## 2012-03-08 MED ORDER — CEFAZOLIN SODIUM-DEXTROSE 2-3 GM-% IV SOLR
INTRAVENOUS | Status: AC
  Filled 2012-03-08: qty 50

## 2012-03-08 MED ORDER — IBUPROFEN 600 MG PO TABS
600.0000 mg | ORAL_TABLET | Freq: Four times a day (QID) | ORAL | Status: DC
  Administered 2012-03-09 – 2012-03-10 (×6): 600 mg via ORAL
  Filled 2012-03-08 (×3): qty 1

## 2012-03-08 MED ORDER — SCOPOLAMINE 1 MG/3DAYS TD PT72
MEDICATED_PATCH | TRANSDERMAL | Status: AC
  Administered 2012-03-08: 1.5 mg via TRANSDERMAL
  Filled 2012-03-08: qty 1

## 2012-03-08 MED ORDER — BUPIVACAINE HCL (PF) 0.25 % IJ SOLN
INTRAMUSCULAR | Status: DC | PRN
  Administered 2012-03-08: 20 mL

## 2012-03-08 MED ORDER — OXYCODONE-ACETAMINOPHEN 5-325 MG PO TABS
1.0000 | ORAL_TABLET | ORAL | Status: DC | PRN
  Administered 2012-03-09 (×4): 1 via ORAL
  Administered 2012-03-10 (×2): 2 via ORAL
  Filled 2012-03-08: qty 2
  Filled 2012-03-08 (×3): qty 1
  Filled 2012-03-08: qty 2
  Filled 2012-03-08: qty 1

## 2012-03-08 MED ORDER — LACTATED RINGERS IV SOLN
INTRAVENOUS | Status: DC | PRN
  Administered 2012-03-08: 10:00:00 via INTRAVENOUS

## 2012-03-08 MED ORDER — ONDANSETRON HCL 4 MG/2ML IJ SOLN
INTRAMUSCULAR | Status: DC | PRN
  Administered 2012-03-08: 4 mg via INTRAVENOUS

## 2012-03-08 MED ORDER — SCOPOLAMINE 1 MG/3DAYS TD PT72
1.0000 | MEDICATED_PATCH | Freq: Once | TRANSDERMAL | Status: DC
  Filled 2012-03-08: qty 1

## 2012-03-08 MED ORDER — BUPIVACAINE IN DEXTROSE 0.75-8.25 % IT SOLN
INTRATHECAL | Status: DC | PRN
  Administered 2012-03-08: 1.2 mL via INTRATHECAL

## 2012-03-08 SURGICAL SUPPLY — 51 items
ADH SKN CLS APL DERMABOND .7 (GAUZE/BANDAGES/DRESSINGS)
APL SKNCLS STERI-STRIP NONHPOA (GAUZE/BANDAGES/DRESSINGS)
BENZOIN TINCTURE PRP APPL 2/3 (GAUZE/BANDAGES/DRESSINGS) IMPLANT
BLADE EXTENDED COATED 6.5IN (ELECTRODE) IMPLANT
BLADE HEX COATED 2.75 (ELECTRODE) ×1 IMPLANT
BOOTIES KNEE HIGH SLOAN (MISCELLANEOUS) ×4 IMPLANT
CLOTH BEACON ORANGE TIMEOUT ST (SAFETY) ×2 IMPLANT
CONTAINER PREFILL 10% NBF 15ML (MISCELLANEOUS) ×4 IMPLANT
DERMABOND ADVANCED (GAUZE/BANDAGES/DRESSINGS)
DERMABOND ADVANCED .7 DNX12 (GAUZE/BANDAGES/DRESSINGS) IMPLANT
DRAIN JACKSON PRT FLT 7MM (DRAIN) ×1 IMPLANT
DRAPE LG THREE QUARTER DISP (DRAPES) ×2 IMPLANT
DRESSING TELFA 8X3 (GAUZE/BANDAGES/DRESSINGS) IMPLANT
DRSG OPSITE POSTOP 4X10 (GAUZE/BANDAGES/DRESSINGS) ×1 IMPLANT
DURAPREP 26ML APPLICATOR (WOUND CARE) ×2 IMPLANT
ELECT REM PT RETURN 9FT ADLT (ELECTROSURGICAL) ×2
ELECTRODE REM PT RTRN 9FT ADLT (ELECTROSURGICAL) ×1 IMPLANT
EVACUATOR SILICONE 100CC (DRAIN) ×1 IMPLANT
EXTRACTOR VACUUM KIWI (MISCELLANEOUS) IMPLANT
EXTRACTOR VACUUM M CUP 4 TUBE (SUCTIONS) IMPLANT
GAUZE SPONGE 4X4 12PLY STRL LF (GAUZE/BANDAGES/DRESSINGS) ×4 IMPLANT
GLOVE SURG SS PI 6.5 STRL IVOR (GLOVE) ×4 IMPLANT
GOWN PREVENTION PLUS LG XLONG (DISPOSABLE) ×6 IMPLANT
KIT ABG SYR 3ML LUER SLIP (SYRINGE) IMPLANT
NDL HYPO 25X5/8 SAFETYGLIDE (NEEDLE) ×1 IMPLANT
NDL SPNL 22GX3.5 QUINCKE BK (NEEDLE) ×1 IMPLANT
NEEDLE HYPO 25X5/8 SAFETYGLIDE (NEEDLE) ×2 IMPLANT
NEEDLE SPNL 22GX3.5 QUINCKE BK (NEEDLE) ×2 IMPLANT
NS IRRIG 1000ML POUR BTL (IV SOLUTION) ×4 IMPLANT
PACK C SECTION WH (CUSTOM PROCEDURE TRAY) ×2 IMPLANT
PAD ABD 7.5X8 STRL (GAUZE/BANDAGES/DRESSINGS) ×2 IMPLANT
PAD OB MATERNITY 4.3X12.25 (PERSONAL CARE ITEMS) IMPLANT
SLEEVE SCD COMPRESS KNEE MED (MISCELLANEOUS) IMPLANT
SPONGE GAUZE 4X4 12PLY (GAUZE/BANDAGES/DRESSINGS) ×1 IMPLANT
STRIP CLOSURE SKIN 1/4X4 (GAUZE/BANDAGES/DRESSINGS) IMPLANT
SUT CHROMIC 2 0 SH (SUTURE) ×4 IMPLANT
SUT MNCRL AB 3-0 PS2 27 (SUTURE) ×2 IMPLANT
SUT SILK 0 FSL (SUTURE) IMPLANT
SUT VIC AB 0 CT1 27 (SUTURE) ×4
SUT VIC AB 0 CT1 27XBRD ANBCTR (SUTURE) ×2 IMPLANT
SUT VIC AB 0 CT1 36 (SUTURE) IMPLANT
SUT VIC AB 0 CTXB 36 (SUTURE) IMPLANT
SUT VIC AB 2-0 CT1 27 (SUTURE) ×4
SUT VIC AB 2-0 CT1 TAPERPNT 27 (SUTURE) ×2 IMPLANT
SUT VIC AB 2-0 SH 27 (SUTURE)
SUT VIC AB 2-0 SH 27XBRD (SUTURE) IMPLANT
SYR CONTROL 10ML LL (SYRINGE) ×2 IMPLANT
TAPE CLOTH SURG 4X10 WHT LF (GAUZE/BANDAGES/DRESSINGS) ×1 IMPLANT
TOWEL OR 17X24 6PK STRL BLUE (TOWEL DISPOSABLE) ×6 IMPLANT
TRAY FOLEY CATH 14FR (SET/KITS/TRAYS/PACK) ×2 IMPLANT
WATER STERILE IRR 1000ML POUR (IV SOLUTION) ×2 IMPLANT

## 2012-03-08 NOTE — H&P (Signed)
Briana Campbell is a 37 y.o.Married muslim female presenting at [redacted]w[redacted]d for a scheduled repeat cesarean section and tubal sterilization.  Accompanied by husband this AM. Interpreter from [employer], [place] interpreted for the patient in Arabic. She has been NPO since MN.  She denies VB, LOF, or abnl d/c.  No regular ctxs.  No UTI or PIH s/s. No recent illness.   Prenatal course: Pt entered care for NOB w/u at [facility] at [redacted]w[redacted]d.  Returned at 16 weeks for u/s and twin gestation dx'd via u/s and S c/w D; di/di twins noted.  She had anatomy u/s at [redacted]w[redacted]d, and normal screen x2; random glucose that day=81, and HgA1c=5.4.  Her pregnancy has been overall uncomplicated, despite AMA and twin gestation.  She had an early gtt=101, and repeat at onset of 3rd trimester also WNL=107.  She has struggled w/ fatigue as 3rd trimester has progressed and fetal sizes have continued to grow.  She did receive a flu shot at [redacted]w[redacted]d.  Last EFW at [redacted]w[redacted]d and A=6+10 (60.3%) & B=7+1 (77.4%), and u/s's have demonstrated concordant growth throughout the pregnancy.  OB Hx: G1=IUFD '94 at 40 weeks, Female, in [place] G2=SVD '95 at 40w, Female in [place] G3=SAB '97 at 4-5 months G4=C/s '99, F=6+7 in [place] G5= C/s '06 F=6+7 in [place] G6=C/s '11, twins at 38 weeks, breech x2, IDGDM, F=6lb & F=6+2 ([facility]) G7=current .Marland Kitchen Patient Active Problem List  Diagnosis  . History of cesarean section  . Late prenatal care  . AMA (advanced maternal age) multigravida 35+  . Female circumcision  . Obesity  . Hx gestational diabetes  . Twin gestation, dichorionic diamniotic   Maternal Medical History:  Fetal activity: Perceived fetal activity is normal.      OB History    Grav Para Term Preterm Abortions TAB SAB Ect Mult Living   7 5 5  0 1 0 1 0 2 5     Obstetric Comments   2011: GDM ON INSULIN; THIRD TRIMESTER BLEEDING HX PPD DEPRESSION X 1 MONTH WITH AFTER ONE OF ABOVE DELIVERIES     Past Medical History  Diagnosis Date  .  Depression     PP  . History of chicken pox   . Gestational diabetes     last preg only   Past Surgical History  Procedure Date  . Female circumcision     SKIN TAG PRESENT  . Cesarean section     x 3   Family History: family history includes Asthma in her daughter and Hypertension in her mother.  There is no history of Anesthesia problems. Social History:  reports that she has never smoked. She has never used smokeless tobacco. She reports that she does not drink alcohol or use illicit drugs.   Prenatal Transfer Tool  Maternal Diabetes: No Genetic Screening: Normal Maternal Ultrasounds/Referrals: Normal Fetal Ultrasounds or other Referrals:  None Maternal Substance Abuse:  No Significant Maternal Medications:  None Significant Maternal Lab Results:  Lab values include: Group B Strep negative Other Comments:  twins di/di w/ concordant growth; this is 2nd set of twins (2 girls in 2011)  Review of Systems  Constitutional: Negative.   HENT: Negative.   Eyes: Negative.   Respiratory: Negative.   Cardiovascular: Negative.   Gastrointestinal: Negative.   Genitourinary: Negative.   Skin: Negative.   Neurological: Negative.       Blood pressure 132/78, pulse 100, temperature 98.1 F (36.7 C), temperature source Oral, resp. rate 20, last menstrual period 06/06/2011. Maternal Exam:  Uterine Assessment: Contraction strength is mild.  Contraction frequency is rare.   Abdomen: Patient reports no abdominal tenderness. Surgical scars: low transverse.   Introitus: not evaluated.   Cervix: not evaluated.   Fetal Exam Fetal Monitor Review: Mode: hand-held doppler probe.       Physical Exam  Constitutional: She is oriented to person, place, and time. She appears well-developed and well-nourished. No distress.  HENT:  Head: Normocephalic and atraumatic.  Eyes: Pupils are equal, round, and reactive to light.  Cardiovascular: Normal rate and regular rhythm.   Respiratory:  Effort normal and breath sounds normal.  GI: Soft.       Gravid; S>D  Genitourinary:       Pelvic exam deferred  Musculoskeletal: She exhibits edema.       1-2+ BLE edema  Neurological: She is alert and oriented to person, place, and time. She has normal reflexes.       No clonus  Skin: Skin is warm and dry.  Psychiatric: She has a normal mood and affect. Her behavior is normal. Judgment and thought content normal.    Prenatal labs: ABO, Rh: --/--/B POS (12/30 1410) Antibody: NEG (12/30 1410) Rubella: 109.8 (07/17 1526) RPR: NON REACTIVE (12/30 1410)  HBsAg: NEGATIVE (07/17 1526)  HIV: NON REACTIVE (07/17 1526)  GBS: NEGATIVE (12/19 1801)  Early 1hr gtt=101, repeat =107 HgA1c in 2nd trimester=5.4 Quad screen negative Pap negative 09/21/11  Assessment/Plan: 1. [redacted]w[redacted]d 2. H/o previous c/s x3 3. Twins (di/di) 4. GBS neg 5. AMA 6. Desires sterilization  1. Admit to [facility] with Dr. Pennie Rushing as attending  2. Routine preop orders 3. Repeat c/s rec'd for delivery secondary to 3 previous c/s.  Risk of c/s include, but not limited to infection, bleeding, damage to surrounding organs, and anesthesia complications.  Pt verbalizes understanding of these risks, and agreeable w/ plan to proceed w/ cesarean section for delivery of twins today 4.Patient desires permanent sterilization.  Other reversible forms of contraception were discussed with patient; she declines all other modalities. Risks of procedure discussed with patient including but not limited to: risk of regret, permanence of method, bleeding, infection, injury to surrounding organs and need for additional procedures.  Failure risk of 0.5-1% with increased risk of ectopic gestation if pregnancy occurs was also discussed with patient if partial salpingectomy is done.  Total bilateral salpingectomy is planned in light of its probable benefit in ovarian cancer prevention.  Patient verbalized understanding of these risks and wants to proceed  with sterilization.  Written informed consent obtained via above named interpreter. 5. MD to follow   STEELMAN,CANDICE H 03/08/2012, 7:51 AM

## 2012-03-08 NOTE — Anesthesia Procedure Notes (Signed)
Spinal  Patient location during procedure: OR Start time: 03/08/2012 9:12 AM Staffing Anesthesiologist: Willam Munford A. Performed by: anesthesiologist  Preanesthetic Checklist Completed: patient identified, site marked, surgical consent, pre-op evaluation, timeout performed, IV checked, risks and benefits discussed and monitors and equipment checked Spinal Block Patient position: sitting Prep: site prepped and draped and DuraPrep Patient monitoring: cardiac monitor, continuous pulse ox, blood pressure and heart rate Approach: midline Location: L3-4 Injection technique: catheter Needle Needle type: Tuohy and Sprotte  Needle gauge: 24 G Needle length: 12.7 cm Needle insertion depth: 6 cm Catheter type: closed end flexible Catheter size: 19 g Assessment Sensory level: T4 Additional Notes Patient identified. Risks and benefits discussed including failed block, incomplete  Pain control, post dural puncture headache, nerve damage, paralysis, blood pressure Changes, nausea, vomiting, reactions to medications-both toxic and allergic and post Partum back pain. All questions were answered. Patient expressed understanding and wished to proceed. Sterile technique was used throughout procedure. Epidural site was Dressed with sterile barrier dressing. No paresthesias, signs of intravascular injection were encountered. 24 Ga 12.7cm sprotte needle passed through epidural needle to perform LP. CSF was clear, free flowing with no heme or paresthesias encountered. The Sprotte  Needle was withdrawn and the epidural catheter was threaded 6cm into the epidural space. The Touhy needle was withdrawn and a sterile dressing was applied. The patient was placed In supine position with LUD. Sensory level was checked with cold at T4. Patient tolerated procedure well.

## 2012-03-08 NOTE — Progress Notes (Signed)
Briana Campbell, interpretor assisted with pre-op

## 2012-03-08 NOTE — Op Note (Signed)
Cesarean Section Procedure Note  Indications: previous uterine incision kerr x3 or greater   Twin gestation   Desire surgical sterilization  Pre-operative Diagnosis: 38 week 2 day pregnancy.  Post-operative Diagnosis: same  Surgeon: Hal Morales  First Assistant: Jaymes Graff  Surgeon: Hal Morales   Assistants: Alonna Minium certified nurse midwife  Anesthesia: Epidural anesthesia and Spinal anesthesia  ASA Class: 3  Procedure Details  The patient was seen in the Holding Room. The risks, benefits, complications, treatment options, and expected outcomes were discussed with the patient the her Arabic interpreter Collins [place] from language resources.  The patient concurred with the proposed plan, giving informed consent.  The site of surgery properly noted/marked. The patient was taken to Operating Room # 9, identified as Veva Lewelling and the procedure verified as C-Section Delivery and bilateral tubal sterilization by salpingectomy. A Time Out was held and the above information confirmed.  After induction of anesthesia, the patient was  prepped with chlor prep in the usual sterile manner.A foley catheter was placed under sterile conditions.  The patient was then draped in the usual fashion.   Suprapubicsubcutaneous injection of 0.25% Bupivacaine   A Pfannenstiel incision was made and carried down through the subcutaneous tissue to the fascia. Fascial incision was made and extended transversely. The fascia was separated from the underlying rectus tissue superiorly and inferiorly. The peritoneum was identified and entered and lysis of adhesions between the peritoneum and anterior uterine serosa undertaken. Peritoneal incision was extended longitudinally.  An Alexis retractor was placed.   A low transverse uterine incision was made two cm above the uterovesical fold, and that incision extended transversely bluntly.The first infant was  delivered from occiput posterior  presentation was a living female infant who voided on the table with Apgar scores of 9 at one minute and 9 at five minutes. After the umbilical cord was clamped and cut cord blood was obtained for evaluation. The second amniotic sac was ruptured and the second infant delivered from the occiput transverse position. He was a living female infant with Apgars of 8 at 1 minute and 9 at 5 minutes. He likewise voided while still on the operating table and was taken to the awaiting pediatricians after his cord was clamped and cut. The placenta was allowed to spontaneously separate from the uterus and was removed intact and appeared normal. The uterine outline, tubes and ovaries appeared norma for the gravid state. The uterine incision was closed with running locked sutures of 0 Vicryl. An imbricating layer of sutures was placed. Hemostasis was observed after placement of several hemostatic sutures of 0 Vicryl. The tubal sterilization procedure was begun. The left fallopian tube was identified and followed to its fimbriated end. A salpingectomy was performed by successively clamping cutting and suture ligating the cells without pain for a long the length of the tube up to the uterotubal junction which was likewise clamped cut and suture ligated for hemostasis. The tube was removed from the operative field. Hemostasis was noted. All sutures in this procedure were 2-0 chromic. A similar procedure was carried out on the opposite side with the right fallopian tube. . Lavage was carried out until clear. The peritoneum was closed with a running suture of 2-0 Vicryl.  The rectus muscles were reapproximated with a figure of 8 suture of 2-0 Vicryl.  The fascia was then reapproximated with a running sutures of 0 Vicryl .Renforcing figure of 8 sutures of 0Vicryl were placed on either side of midline.  The subcutaneous  tissue was copiously irrigated and a 7 mm Jackson-Pratt drain was placed into the subcutaneous tissue through a stab  wound in the left lower quadrant. It was sutured in with a suture of 2-0 silk. The subcutaneous tissue was reapproximated with a running suture of 2-0 Vicryl. The skin was reapproximated with a subcuticular suture of 3-0 moncryl.  A sterile dressing was applied. The infant remained in the operating room to enjoy a bonding with skin to skin placement. They were both admitted to the full-term nursery.  Weights were pending at the time of this dictation  Instrument, sponge, and needle counts were correct prior to the abdominal closure and at the conclusion of the case.   Findings:  Placenta contained a 3 vessel cord for each infant    Estimated Blood Loss:  750 cc         Drains: 7 mm Jackson-Pratt         Total IV Fluids: 2000 ml         Specimens: Right and left fallopian tubes         Implants: None         Complications: ::"None; patient tolerated the procedure well."         Disposition: PACU - hemodynamically stable.         Condition: stable  Attending Attestation: I performed the procedure.  Breaker Springer P  03/08/2012 11:23 AM

## 2012-03-08 NOTE — Transfer of Care (Signed)
Immediate Anesthesia Transfer of Care Note  Patient: Briana Campbell  Procedure(s) Performed: Procedure(s) (LRB) with comments: CESAREAN SECTION (N/A) - Repeat /Twins  Patient Location: PACU  Anesthesia Type:Spinal  Level of Consciousness: awake, alert  and oriented  Airway & Oxygen Therapy: Patient Spontanous Breathing  Post-op Assessment: Report given to PACU RN  Post vital signs: Reviewed and stable  Complications: No apparent anesthesia complications

## 2012-03-08 NOTE — Progress Notes (Signed)
UR chart review completed.  

## 2012-03-08 NOTE — Anesthesia Preprocedure Evaluation (Addendum)
Anesthesia Evaluation  Patient identified by MRN, date of birth, ID band Patient awake    Reviewed: Allergy & Precautions, H&P , NPO status , Patient's Chart, lab work & pertinent test results  Airway Mallampati: III TM Distance: >3 FB Neck ROM: Full    Dental No notable dental hx. (+) Teeth Intact   Pulmonary neg pulmonary ROS,  breath sounds clear to auscultation  Pulmonary exam normal       Cardiovascular negative cardio ROS  Rhythm:Regular Rate:Normal + Systolic murmurs 2/6 SEM @RUSB    Neuro/Psych PSYCHIATRIC DISORDERS Depression negative neurological ROS     GI/Hepatic negative GI ROS, Neg liver ROS,   Endo/Other  diabetes, Well Controlled, GestationalMorbid obesity  Renal/GU negative Renal ROS   Hx/o female circucision    Musculoskeletal negative musculoskeletal ROS (+)   Abdominal (+) + obese,   Peds  Hematology negative hematology ROS (+)   Anesthesia Other Findings   Reproductive/Obstetrics (+) Pregnancy Twin Gestation Previous C/Section AMA                          Anesthesia Physical Anesthesia Plan  ASA: III  Anesthesia Plan: Spinal and Combined Spinal and Epidural   Post-op Pain Management:    Induction:   Airway Management Planned: Natural Airway  Additional Equipment:   Intra-op Plan:   Post-operative Plan:   Informed Consent: I have reviewed the patients History and Physical, chart, labs and discussed the procedure including the risks, benefits and alternatives for the proposed anesthesia with the patient or authorized representative who has indicated his/her understanding and acceptance.   Dental advisory given  Plan Discussed with: CRNA, Anesthesiologist and Surgeon  Anesthesia Plan Comments:        Anesthesia Quick Evaluation

## 2012-03-09 ENCOUNTER — Encounter (HOSPITAL_COMMUNITY): Payer: Self-pay | Admitting: Obstetrics and Gynecology

## 2012-03-09 DIAGNOSIS — Z98891 History of uterine scar from previous surgery: Secondary | ICD-10-CM | POA: Diagnosis present

## 2012-03-09 LAB — CBC
HCT: 33.5 % — ABNORMAL LOW (ref 36.0–46.0)
MCHC: 33.1 g/dL (ref 30.0–36.0)
RDW: 14.1 % (ref 11.5–15.5)

## 2012-03-09 NOTE — Progress Notes (Signed)
Patient was referred for history of depression/anxiety. * Referral screened out by Clinical Social Worker because none of the following criteria appear to apply: ~ History of anxiety/depression during this pregnancy, or of post-partum depression. ~ Diagnosis of anxiety and/or depression within last 3 years ~ History of depression due to pregnancy loss/loss of child OR * Patient's symptoms currently being treated with medication and/or therapy. Please contact the Clinical Social Worker if needs arise, or if patient requests.  MOB denies hx of PPD.  We discussed signs and symptoms of PPD to watch for and MOB states she feels comfortable talking with her doctor if symptoms arise.  She reports feeling well emotionally at this time.  FOB involved and supportive.

## 2012-03-09 NOTE — Progress Notes (Signed)
Subjective: Postpartum Day 1: Cesarean Delivery and BTL  Patient reports incisional pain and tolerating PO.    Objective: Vital signs in last 24 hours: Temp:  [97.7 F (36.5 C)-99.6 F (37.6 C)] 98.5 F (36.9 C) (01/03 0925) Pulse Rate:  [69-98] 82  (01/03 0925) Resp:  [16-22] 20  (01/03 0925) BP: (99-148)/(60-81) 102/69 mmHg (01/03 0925) SpO2:  [94 %-99 %] 98 % (01/03 0925) Weight:  [235 lb (106.595 kg)] 235 lb (106.595 kg) (01/02 1303)  Physical Exam:  General: no distress Lochia: appropriate Uterine Fundus: firm Incision: Dressing OK DVT Evaluation: No evidence of DVT seen on physical exam.   Basename 03/09/12 0555  HGB 11.1*  HCT 33.5*    Assessment/Plan: Status post Cesarean section. Doing well postoperatively.  Medication for gas pain given. Will try warm prune juice.  Briana Campbell V 03/09/2012, 11:11 AM

## 2012-03-09 NOTE — Addendum Note (Signed)
Addendum  created 03/09/12 0744 by Algis Greenhouse, CRNA   Modules edited:Notes Section

## 2012-03-09 NOTE — Anesthesia Postprocedure Evaluation (Signed)
Anesthesia Post Note  Patient: Briana Campbell  Procedure(s) Performed: Procedure(s) (LRB): CESAREAN SECTION (N/A)  Anesthesia type: Spinal/ Epidural  Patient location: Mother/Baby  Post pain: Pain level controlled  Post assessment: Post-op Vital signs reviewed  Last Vitals:  Filed Vitals:   03/09/12 0622  BP: 108/64  Pulse: 89  Temp: 37.4 C  Resp: 20    Post vital signs: Reviewed  Level of consciousness: awake  Complications: No apparent anesthesia complications

## 2012-03-09 NOTE — Anesthesia Postprocedure Evaluation (Signed)
  Anesthesia Post-op Note  Patient: Briana Campbell  Procedure(s) Performed: Procedure(s) (LRB) with comments: CESAREAN SECTION (N/A) - Bilateral Salpingectomy  Patient Location: PACU  Anesthesia Type:Spinal  Level of Consciousness: awake, alert  and oriented  Airway and Oxygen Therapy: Patient Spontanous Breathing  Post-op Pain: none  Post-op Assessment: Post-op Vital signs reviewed, Patient's Cardiovascular Status Stable, Respiratory Function Stable, Patent Airway, No signs of Nausea or vomiting, Pain level controlled, No headache and No backache  Post-op Vital Signs: Reviewed and stable  Complications: No apparent anesthesia complications

## 2012-03-10 LAB — TYPE AND SCREEN: Unit division: 0

## 2012-03-10 MED ORDER — OXYCODONE-ACETAMINOPHEN 5-325 MG PO TABS: 1.0000 | ORAL_TABLET | ORAL | Status: DC | PRN

## 2012-03-10 MED ORDER — FERROUS SULFATE 325 (65 FE) MG PO TABS: 325.0000 mg | ORAL_TABLET | Freq: Three times a day (TID) | ORAL | Status: DC

## 2012-03-10 MED ORDER — IBUPROFEN 800 MG PO TABS: 800.0000 mg | ORAL_TABLET | Freq: Three times a day (TID) | ORAL | Status: DC | PRN

## 2012-03-10 NOTE — Progress Notes (Signed)
Subjective:  Postpartum Day 2: Cesarean Delivery Patient reports tolerating PO and + flatus.    Objective: Vital signs in last 24 hours: Temp:  [98 F (36.7 C)-98.9 F (37.2 C)] 98.1 F (36.7 C) (01/04 0559) Pulse Rate:  [81-91] 81  (01/04 0559) Resp:  [18] 18  (01/04 0559) BP: (95-136)/(66-83) 95/66 mmHg (01/04 0559)  Physical Exam:  General: no distress Lochia: appropriate Uterine Fundus: firm Incision: healing well, no significant drainage DVT Evaluation: No evidence of DVT seen on physical exam.  JP drain removed   Basename 03/09/12 0555  HGB 11.1*  HCT 33.5*    Assessment/Plan: Anemia Status post Cesarean section. Doing well postoperatively.  Discharge home with standard precautions and return to clinic in 4-6 weeks.  Ewelina Naves V 03/10/2012, 10:18 AM

## 2012-03-21 NOTE — Discharge Summary (Signed)
Cesarean Section Delivery Discharge Summary  Briana Campbell  DOB:    [birth year] MRN:    [id number] CSN:    [id number]  Date of admission:                  March 08, 2012  Date of discharge:                   March 10, 2012  Procedures this admission:  March 08, 2012 repeat low transverse cesarean section and bilateral tubal sterilization procedure via bilateral total salpingectomies by Dr. Dierdre Forth  Newborn Data:    Briana Campbell [[id number]]  Live born female  Birth Weight: 7 lb 3.2 oz (3265 g) APGAR: 9, 9   Briana Campbell [[id number]]  Live born female  Birth Weight: 7 lb 2.3 oz (3240 g) APGAR: 8, 9  Home with mother.  History of Present Illness:  Ms. Briana Campbell is a 37 y.o. female, [id number], who presents at [redacted]w[redacted]d weeks gestation. The patient has been followed at the [facility] and Gynecology division of [facility].    Her pregnancy has been complicated by: twin gestation, 3 prior cesarean sections, desire for sterilization, age greater than 57, history of intrauterine fetal demise in the past, history of preterm labor in the past, obesity, history of depression and postpartum depression, and history of gestational diabetes mellitus.  Patient Active Problem List  Diagnosis  . History of cesarean section  . Late prenatal care  . AMA (advanced maternal age) multigravida 35+  . Female circumcision  . Obesity  . Hx gestational diabetes  . Twin gestation, dichorionic diamniotic  . Status post repeat low transverse cesarean section   ultrasound.  Hospital course:  The patient was admitted for repeat cesarean section.   Her postpartum course was not complicated. She remained afebrile. She was discharged to home on postpartum day 2 doing well.  Feeding:  breast and bottle  Contraception:  bilateral tubal ligation  Discharge hemoglobin:  Hemoglobin  Date Value Range Status  03/09/2012 11.1* 12.0 - 15.0 g/dL Final      HCT  Date Value Range Status  03/09/2012 33.5* 36.0 - 46.0 % Final    Discharge Physical Exam:   General: no distress Lochia: appropriate Uterine Fundus: firm Incision: healing well, no significant drainage DVT Evaluation: No evidence of DVT seen on physical exam.  Intrapartum Procedures: cesarean: low cervical, transverse and tubal ligation Postpartum Procedures: none Complications-Operative and Postpartum: none  Discharge Diagnoses: Term Pregnancy-delivered and twin gestation, 3 prior cesarean sections, desire for sterilization, advanced maternal age, history of intrauterine fetal demise in the past, history of depression and postpartum depression, history of anxiety, obesity, history of preterm delivery, history of gestational diabetes mellitus in the past, and anemia.  Discharge Information:  Activity:           pelvic rest Diet:                routine Medications: PNV, Ibuprofen, Iron and Percocet Condition:      stable and improved Instructions:  care after cesarean section and postpartum depression Discharge to: home  Follow-up Information    Follow up with [facility], MD. In 6 weeks.   Contact information:   [address] [place] [address] [phone]           Janine Limbo 03/21/2012

## 2012-04-02 ENCOUNTER — Encounter: Payer: Medicaid Other | Admitting: Obstetrics and Gynecology

## 2012-04-17 ENCOUNTER — Ambulatory Visit: Payer: Medicaid Other | Admitting: Obstetrics and Gynecology

## 2012-04-17 ENCOUNTER — Encounter: Payer: Self-pay | Admitting: Obstetrics and Gynecology

## 2012-04-17 VITALS — BP 110/70 | Temp 98.6°F | Ht 61.0 in | Wt 206.0 lb

## 2012-04-17 DIAGNOSIS — Z9079 Acquired absence of other genital organ(s): Secondary | ICD-10-CM

## 2012-04-17 DIAGNOSIS — Z8632 Personal history of gestational diabetes: Secondary | ICD-10-CM

## 2012-04-17 MED ORDER — PRENATAL MULTIVITAMIN CH: 1.0000 | ORAL_TABLET | Freq: Every day | ORAL | Status: DC

## 2012-04-17 NOTE — Progress Notes (Signed)
Date of delivery: 03/08/2012 Female/Female--twins Name: Briana Campbell Vaginal delivery:no Cesarean section:yes Tubal sterilization:  Bilateral salpingectomy GDM:yes Breast Feeding:yes Bottle Feeding:yes Post-Partum Blues:no Abnormal pap:no Normal GU function: yes Normal GI function:yes Returning to work:no EPDS: SCORE--7  Subjective:     Briana Campbell is a 37 y.o. female who presents for a postpartum visit.  I have fully reviewed the prenatal and intrapartum course.  C/o tooth pain.  Had seen dentist during pregnancy   Patient is not sexually active.   The following portions of the patient's history were reviewed and updated as appropriate: allergies, current medications, past family history, past medical history, past social history, past surgical history and problem list.  Review of Systems Pertinent items are noted in HPI.   Objective:    BP 110/70  Temp(Src) 98.6 F (37 C)  Ht 5\' 1"  (1.549 m)  Wt 206 lb (93.441 kg)  BMI 38.94 kg/m2  General:  alert, cooperative and no distress     Lungs: clear to auscultation bilaterally  Heart:  regular rate and rhythm, S1, S2 normal, no murmur  Abdomen: soft, non-tender; bowel sounds normal; no masses,  no organomegaly.  Incision well healed   Vulva:  normal  Vagina: normal vagina  Cervix:  normal  Corpus: normal size, contour, position, consistency, mobility, non-tender  Adnexa:  normal adnexa             Assessment:     Normal postpartum exam.  Pap smear done at today's visit.   Plan:  followup dental care F/u here for aex.  Dierdre Forth MD 04/17/2012 2:34 PM

## 2012-08-20 ENCOUNTER — Encounter (INDEPENDENT_AMBULATORY_CARE_PROVIDER_SITE_OTHER): Payer: Self-pay | Admitting: General Surgery

## 2012-08-20 ENCOUNTER — Ambulatory Visit (INDEPENDENT_AMBULATORY_CARE_PROVIDER_SITE_OTHER): Payer: Medicaid Other | Admitting: General Surgery

## 2012-08-20 VITALS — BP 130/70 | HR 67 | Temp 98.3°F | Resp 16 | Ht 66.0 in | Wt 222.4 lb

## 2012-08-20 DIAGNOSIS — M6208 Separation of muscle (nontraumatic), other site: Secondary | ICD-10-CM

## 2012-08-20 DIAGNOSIS — M6200 Separation of muscle (nontraumatic), unspecified site: Secondary | ICD-10-CM

## 2012-08-20 NOTE — Progress Notes (Signed)
Patient ID: Briana Campbell, female   DOB: [birth year], 37 y.o.   MRN: [id number]  Chief Complaint  Patient presents with  . New Evaluation    eval abd herna    HPI Briana Campbell is a 37 y.o. female.  The patient is a 37 year old female is referred by Dr. Pennie Rushing for evaluation of a ventral hernia. The patient has a significant history of having previous C-section x4. The patient also had twin pregnancies x2. The patient complains of epigastric supraumbilical pain. She has no pain in her previous C-section areas. HPI  Past Medical History  Diagnosis Date  . Depression     PP  . History of chicken pox     Past Surgical History  Procedure Laterality Date  . Female circumcision      SKIN TAG PRESENT  . Cesarean section      x 3  . Cesarean section  03/08/2012    Procedure: CESAREAN SECTION;  Surgeon: Hal Morales, MD;  Location: [facility];  Service: Obstetrics;  Laterality: N/A;  Bilateral Salpingectomy    Family History  Problem Relation Age of Onset  . Anesthesia problems Neg Hx   . Hypertension Mother   . Asthma Daughter     Social History History  Substance Use Topics  . Smoking status: Never Smoker   . Smokeless tobacco: Never Used  . Alcohol Use: No    No Known Allergies  No current outpatient prescriptions on file.   No current facility-administered medications for this visit.    Review of Systems Review of Systems  Constitutional: Negative.   HENT: Negative.   Eyes: Negative.   Respiratory: Negative.   Cardiovascular: Negative.   Gastrointestinal: Positive for abdominal pain and abdominal distention.  Genitourinary: Negative.   Musculoskeletal: Negative.   Neurological: Negative.   All other systems reviewed and are negative.    Blood pressure 130/70, pulse 67, temperature 98.3 F (36.8 C), temperature source Temporal, resp. rate 16, height 5\' 6"  (1.676 m), weight 222 lb 6.4 oz (100.88 kg).  Physical Exam Physical Exam  Constitutional: She is  oriented to person, place, and time. She appears well-developed and well-nourished.  HENT:  Head: Normocephalic and atraumatic.  Eyes: Conjunctivae and EOM are normal. Pupils are equal, round, and reactive to light.  Neck: Normal range of motion. Neck supple.  Cardiovascular: Normal rate, regular rhythm and normal heart sounds.   Pulmonary/Chest: Effort normal and breath sounds normal.  Abdominal: Soft. Bowel sounds are normal. A hernia is present. Hernia confirmed positive in the ventral area.    Rectus diastases. 0.5 cm reducible umbilical hernia  Musculoskeletal: Normal range of motion.  Neurological: She is alert and oriented to person, place, and time.  Skin: Skin is dry.    Data Reviewed none  Assessment    37 year old female with rectus diastases and small 0.5 cm umbilical hernia.     Plan    1. I discussed with the patient physiology of her rectus diastases as well as her husband. This is likely due to her previous pregnancies twins x2. 2. The umbilical hernia is reducible and the patient would not like to have this fixed at this time. 3. The patient follow up as needed        Marigene Ehlers., Jed Limerick 08/20/2012, 10:36 AM

## 2013-01-01 ENCOUNTER — Ambulatory Visit: Payer: Medicaid Other | Attending: Internal Medicine | Admitting: Internal Medicine

## 2013-01-01 VITALS — BP 109/77 | HR 68 | Temp 98.3°F | Resp 17 | Wt 213.4 lb

## 2013-01-01 DIAGNOSIS — M545 Low back pain, unspecified: Secondary | ICD-10-CM | POA: Insufficient documentation

## 2013-01-01 DIAGNOSIS — R140 Abdominal distension (gaseous): Secondary | ICD-10-CM

## 2013-01-01 DIAGNOSIS — R141 Gas pain: Secondary | ICD-10-CM

## 2013-01-01 LAB — LIPID PANEL
HDL: 45 mg/dL (ref >39)
LDL Cholesterol: 78 mg/dL (ref 0–99)
Triglycerides: 199 mg/dL — ABNORMAL HIGH (ref <150)
VLDL: 40 mg/dL (ref 0–40)

## 2013-01-01 LAB — COMPREHENSIVE METABOLIC PANEL
AST: 13 U/L (ref 0–37)
Albumin: 4.1 g/dL (ref 3.5–5.2)
Alkaline Phosphatase: 135 U/L — ABNORMAL HIGH (ref 39–117)
BUN: 11 mg/dL (ref 6–23)
Potassium: 4 mEq/L (ref 3.5–5.3)
Sodium: 137 mEq/L (ref 135–145)

## 2013-01-01 MED ORDER — TRAMADOL HCL 50 MG PO TABS: 50.0000 mg | ORAL_TABLET | Freq: Three times a day (TID) | ORAL | Status: DC | PRN

## 2013-01-01 NOTE — Progress Notes (Signed)
Patient here to establish care Complains of back pain and some Stomach pain Has history of c-section

## 2013-01-01 NOTE — Progress Notes (Signed)
Patient ID: Briana Campbell, female   DOB: 1976/03/01, 37 y.o.   MRN: [id number]   CC: Followup  HPI: Patient is 37 year old female who presents to clinic with main concern of 10 months duration lower and upper back pain that started after delivery of her twins in January 2014. She describes the pain is intermittent, throbbing and worse when she carries the baby's, no specific alleviating or aggravating factors, 5/10 in severity when present, no specific associated symptoms such as numbness and tingling. She denies any specific trauma still upper or lower back area. She denies similar events in the past. She also reports 10 months duration of abdominal distention and gas, no pain, no changes in weight, no changes in appetite. She denies nausea and vomiting, tolerates by mouth intake well. She denies fevers and chills, no other systemic concerns. States  No Known Allergies Past Medical History  Diagnosis Date  . Depression     PP  . History of chicken pox    No current outpatient prescriptions on file prior to visit.   No current facility-administered medications on file prior to visit.   Family History  Problem Relation Age of Onset  . Anesthesia problems Neg Hx   . Hypertension Mother   . Asthma Daughter    History   Social History  . Marital Status: Married    Spouse Name: [name]    Number of Children: N/A  . Years of Education: HS   Occupational History  . HOMEMAKER    Social History Main Topics  . Smoking status: Never Smoker   . Smokeless tobacco: Never Used  . Alcohol Use: No  . Drug Use: No  . Sexual Activity: Yes     Comment: BTL   Other Topics Concern  . Not on file   Social History Narrative  . No narrative on file    Review of Systems  Constitutional: Negative for fever, chills, diaphoresis, activity change, appetite change and fatigue.  HENT: Negative for ear pain, nosebleeds, congestion, facial swelling, rhinorrhea, neck pain, neck stiffness and  ear discharge.   Eyes: Negative for pain, discharge, redness, itching and visual disturbance.  Respiratory: Negative for cough, choking, chest tightness, shortness of breath, wheezing and stridor.   Cardiovascular: Negative for chest pain, palpitations and leg swelling.  Gastrointestinal: Per history of present illness Genitourinary: Negative for dysuria, urgency, frequency, hematuria, flank pain, decreased urine volume, difficulty urinating and dyspareunia.  Musculoskeletal: Negative for joint swelling.  Neurological: Negative for dizziness, tremors, seizures, syncope, facial asymmetry, speech difficulty, weakness, light-headedness, numbness and headaches.  Hematological: Negative for adenopathy. Does not bruise/bleed easily.  Psychiatric/Behavioral: Negative for hallucinations, behavioral problems, confusion, dysphoric mood, decreased concentration and agitation.    Objective:   Filed Vitals:   01/01/13 0938  BP: 109/77  Pulse: 68  Temp: 98.3 F (36.8 C)  Resp: 17    Physical Exam  Constitutional: Appears well-developed and well-nourished. No distress.  HENT: Normocephalic. External right and left ear normal. Oropharynx is clear and moist.  Eyes: Conjunctivae and EOM are normal. PERRLA, no scleral icterus.  Neck: Normal ROM. Neck supple. No JVD. No tracheal deviation. No thyromegaly.  CVS: RRR, S1/S2 +, no murmurs, no gallops, no carotid bruit.  Pulmonary: Effort and breath sounds normal, no stridor, rhonchi, wheezes, rales.  Abdominal: Soft. BS +,  mild distention noted, no tenderness, rebound or guarding.  Musculoskeletal: Normal range of motion. Significant paraspinal lumbar and thoracic area tenderness  Lymphadenopathy: No lymphadenopathy noted, cervical, inguinal. Neuro:  Alert. Normal reflexes, muscle tone coordination. No cranial nerve deficit. Skin: Skin is warm and dry. No rash noted. Not diaphoretic. No erythema. No pallor.  Psychiatric: Normal mood and affect. Behavior,  judgment, thought content normal.   Lab Results  Component Value Date   WBC 7.6 03/09/2012   HGB 11.1* 03/09/2012   HCT 33.5* 03/09/2012   MCV 89.3 03/09/2012   PLT 185 03/09/2012   Lab Results  Component Value Date   CREATININE 0.57 09/25/2009   BUN 4* 09/25/2009   NA 136 09/25/2009   K 3.9 09/25/2009   CL 105 09/25/2009   CO2 23 09/25/2009    Lab Results  Component Value Date   HGBA1C 5.4 10/18/2011   Lipid Panel  No results found for this basename: chol, trig, hdl, cholhdl, vldl, ldlcalc       Assessment and plan:   Patient Active Problem List   Diagnosis Date Noted  .  lumbar and thoracic pain  -  likely secondary to muscle strains, I have advised patient not to carry children if that's what makes the pain worse, I will prescribe tramadol for symptom relief. Will obtain x-ray of the lumbar and thoracic spine  04/17/2012  .  abdominal distention  - no clear etiology to explain distention, only mild, we have discussed dietary restrictions, avoiding carbohydrates and increasing protein intake in her diet. Will obtain abdominal ultrasound for further evaluation 03/09/2012

## 2013-01-01 NOTE — Patient Instructions (Signed)
Back Pain, Adult  Low back pain is very common. About 1 in 5 people have back pain. The cause of low back pain is rarely dangerous. The pain often gets better over time. About half of people with a sudden onset of back pain feel better in just 2 weeks. About 8 in 10 people feel better by 6 weeks.   CAUSES  Some common causes of back pain include:  · Strain of the muscles or ligaments supporting the spine.  · Wear and tear (degeneration) of the spinal discs.  · Arthritis.  · Direct injury to the back.  DIAGNOSIS  Most of the time, the direct cause of low back pain is not known. However, back pain can be treated effectively even when the exact cause of the pain is unknown. Answering your caregiver's questions about your overall health and symptoms is one of the most accurate ways to make sure the cause of your pain is not dangerous. If your caregiver needs more information, he or she may order lab work or imaging tests (X-rays or MRIs). However, even if imaging tests show changes in your back, this usually does not require surgery.  HOME CARE INSTRUCTIONS  For many people, back pain returns. Since low back pain is rarely dangerous, it is often a condition that people can learn to manage on their own.   · Remain active. It is stressful on the back to sit or stand in one place. Do not sit, drive, or stand in one place for more than 30 minutes at a time. Take short walks on level surfaces as soon as pain allows. Try to increase the length of time you walk each day.  · Do not stay in bed. Resting more than 1 or 2 days can delay your recovery.  · Do not avoid exercise or work. Your body is made to move. It is not dangerous to be active, even though your back may hurt. Your back will likely heal faster if you return to being active before your pain is gone.  · Pay attention to your body when you  bend and lift. Many people have less discomfort when lifting if they bend their knees, keep the load close to their bodies, and  avoid twisting. Often, the most comfortable positions are those that put less stress on your recovering back.  · Find a comfortable position to sleep. Use a firm mattress and lie on your side with your knees slightly bent. If you lie on your back, put a pillow under your knees.  · Only take over-the-counter or prescription medicines as directed by your caregiver. Over-the-counter medicines to reduce pain and inflammation are often the most helpful. Your caregiver may prescribe muscle relaxant drugs. These medicines help dull your pain so you can more quickly return to your normal activities and healthy exercise.  · Put ice on the injured area.  · Put ice in a plastic bag.  · Place a towel between your skin and the bag.  · Leave the ice on for 15-20 minutes, 3-4 times a day for the first 2 to 3 days. After that, ice and heat may be alternated to reduce pain and spasms.  · Ask your caregiver about trying back exercises and gentle massage. This may be of some benefit.  · Avoid feeling anxious or stressed. Stress increases muscle tension and can worsen back pain. It is important to recognize when you are anxious or stressed and learn ways to manage it. Exercise is a great option.  SEEK MEDICAL CARE IF:  · You have pain that is not relieved with rest or   medicine.  · You have pain that does not improve in 1 week.  · You have new symptoms.  · You are generally not feeling well.  SEEK IMMEDIATE MEDICAL CARE IF:   · You have pain that radiates from your back into your legs.  · You develop new bowel or bladder control problems.  · You have unusual weakness or numbness in your arms or legs.  · You develop nausea or vomiting.  · You develop abdominal pain.  · You feel faint.  Document Released: 02/21/2005 Document Revised: 08/23/2011 Document Reviewed: 07/12/2010  Exit[employer]® Patient Information ©2014 [employer].

## 2013-01-04 ENCOUNTER — Ambulatory Visit (HOSPITAL_COMMUNITY)
Admission: RE | Admit: 2013-01-04 | Discharge: 2013-01-04 | Disposition: A | Discharge status: Home / Self Care | Payer: Medicaid Other | Source: Ambulatory Visit | Attending: Internal Medicine | Admitting: Internal Medicine

## 2013-01-04 DIAGNOSIS — R142 Eructation: Secondary | ICD-10-CM | POA: Insufficient documentation

## 2013-01-04 DIAGNOSIS — R141 Gas pain: Secondary | ICD-10-CM | POA: Insufficient documentation

## 2013-01-04 DIAGNOSIS — R140 Abdominal distension (gaseous): Secondary | ICD-10-CM

## 2013-01-04 DIAGNOSIS — M549 Dorsalgia, unspecified: Secondary | ICD-10-CM | POA: Insufficient documentation

## 2013-01-04 DIAGNOSIS — K7689 Other specified diseases of liver: Secondary | ICD-10-CM | POA: Insufficient documentation

## 2013-02-11 ENCOUNTER — Other Ambulatory Visit: Payer: Self-pay | Admitting: Internal Medicine

## 2013-02-19 ENCOUNTER — Encounter: Payer: Self-pay | Admitting: Internal Medicine

## 2013-02-19 ENCOUNTER — Ambulatory Visit: Payer: Medicaid Other | Attending: Internal Medicine | Admitting: Internal Medicine

## 2013-02-19 VITALS — BP 102/73 | HR 70 | Temp 98.6°F | Resp 16 | Ht 63.0 in | Wt 214.0 lb

## 2013-02-19 DIAGNOSIS — R7303 Prediabetes: Secondary | ICD-10-CM

## 2013-02-19 DIAGNOSIS — Z0000 Encounter for general adult medical examination without abnormal findings: Secondary | ICD-10-CM

## 2013-02-19 DIAGNOSIS — R7309 Other abnormal glucose: Secondary | ICD-10-CM

## 2013-02-19 DIAGNOSIS — J3489 Other specified disorders of nose and nasal sinuses: Secondary | ICD-10-CM | POA: Insufficient documentation

## 2013-02-19 DIAGNOSIS — M549 Dorsalgia, unspecified: Secondary | ICD-10-CM | POA: Insufficient documentation

## 2013-02-19 LAB — POCT GLYCOSYLATED HEMOGLOBIN (HGB A1C): Hemoglobin A1C: 5.6

## 2013-02-19 MED ORDER — LORATADINE 10 MG PO TABS: 10.0000 mg | ORAL_TABLET | Freq: Every day | ORAL | Status: AC

## 2013-02-19 NOTE — Progress Notes (Signed)
Pt is here following up on her lower back pain. She is also having pain in her right shoulder. Pt thinks that she might have a sinus infection.

## 2013-02-19 NOTE — Progress Notes (Signed)
Patient ID: Briana Campbell, female   DOB: November 06, 1975, 37 y.o.   MRN: [id number]   Subjective: 37 year old obese [place] female who is here for followup of her recent evaluation and workup. She was seen in the clinic for pain in her back and some abdominal distention. She had ultrasound of her abdomen done which showed fatty liver changes. She denies use of alcohol but does have a high fatty intake in diet. She denies any further back pain or abdominal pain. Denies any fever, chills, headache, blurred vision, dizziness, nausea, vomiting, chest pain, palpitations, shortness of breath, abdominal pain, dysuria, bowel symptoms. She denies any change in her weight or appetite. She does report some nasal congestion.  Review of systems As outlined in history of present illness   Objective:  Vital signs in last 24 hours:  Filed Vitals:   02/19/13 1112  BP: 102/73  Pulse: 70  Temp: 98.6 F (37 C)  TempSrc: Oral  Resp: 16  Height: 5\' 3"  (1.6 m)  Weight: 214 lb (97.07 kg)  SpO2: 99%     Physical Exam:  General: Middle aged obese female in no acute distress. HEENT: no pallor, no icterus, moist oral mucosa, enlarged thyroid Heart: Normal  s1 &s2  Regular rate and rhythm,  Lungs: Clear to auscultation bilaterally. Abdomen: Soft, nontender, nondistended, positive bowel sounds. Extremities: , No edema Neuro: Alert, awake, oriented x3, nonfocal.   Lab Results:  Basic Metabolic Panel:    Component Value Date/Time   NA 137 01/01/2013 1000   K 4.0 01/01/2013 1000   CL 99 01/01/2013 1000   CO2 30 01/01/2013 1000   BUN 11 01/01/2013 1000   CREATININE 0.52 01/01/2013 1000   CREATININE 0.57 09/25/2009 0800   GLUCOSE 126* 01/01/2013 1000   CALCIUM 9.3 01/01/2013 1000   CBC:    Component Value Date/Time   WBC 7.6 03/09/2012 0555   HGB 11.1* 03/09/2012 0555   HCT 33.5* 03/09/2012 0555   PLT 185 03/09/2012 0555   MCV 89.3 03/09/2012 0555   NEUTROABS 4.8 09/21/2011 1526   LYMPHSABS 2.4 09/21/2011  1526   MONOABS 0.3 09/21/2011 1526   EOSABS 0.1 09/21/2011 1526   BASOSABS 0.0 09/21/2011 1526    No results found for this or any previous visit (from the past 240 hour(s)).  Studies/Results: No results found.  Medications: Scheduled Meds: Continuous Infusions: PRN Meds:.    Assessment/Plan:  Back pain and abdominal distention Resolved. Ultrasound of the abdomen showed a fatty liver. Transaminases and bilirubin normal. Mild elevation of alkaline phosphatase noted. Counseled on weight loss and reduce  fat intake in diet.  Will recheck liver function during next visit.  Sinus congestion Will order loratadine. Denies breast-feeding at this time  ?prediabetes Check A1c today.   Followup in 6 months  Briana Campbell 02/19/2013, 11:48 AM

## 2013-05-08 ENCOUNTER — Encounter: Payer: Self-pay | Admitting: Internal Medicine

## 2013-05-08 ENCOUNTER — Ambulatory Visit: Payer: Medicaid Other | Attending: Internal Medicine | Admitting: Internal Medicine

## 2013-05-08 VITALS — BP 122/82 | HR 74 | Temp 98.1°F | Resp 16 | Wt 217.6 lb

## 2013-05-08 DIAGNOSIS — R7303 Prediabetes: Secondary | ICD-10-CM

## 2013-05-08 DIAGNOSIS — F329 Major depressive disorder, single episode, unspecified: Secondary | ICD-10-CM | POA: Insufficient documentation

## 2013-05-08 DIAGNOSIS — R7301 Impaired fasting glucose: Secondary | ICD-10-CM | POA: Insufficient documentation

## 2013-05-08 DIAGNOSIS — Z0000 Encounter for general adult medical examination without abnormal findings: Secondary | ICD-10-CM

## 2013-05-08 DIAGNOSIS — N920 Excessive and frequent menstruation with regular cycle: Secondary | ICD-10-CM

## 2013-05-08 DIAGNOSIS — Z09 Encounter for follow-up examination after completed treatment for conditions other than malignant neoplasm: Secondary | ICD-10-CM | POA: Insufficient documentation

## 2013-05-08 DIAGNOSIS — F3289 Other specified depressive episodes: Secondary | ICD-10-CM | POA: Insufficient documentation

## 2013-05-08 DIAGNOSIS — E669 Obesity, unspecified: Secondary | ICD-10-CM

## 2013-05-08 DIAGNOSIS — Z139 Encounter for screening, unspecified: Secondary | ICD-10-CM

## 2013-05-08 DIAGNOSIS — R7309 Other abnormal glucose: Secondary | ICD-10-CM

## 2013-05-08 LAB — CBC WITH DIFFERENTIAL/PLATELET
BASOS PCT: 0 % (ref 0–1)
Basophils Absolute: 0 10*3/uL (ref 0.0–0.1)
EOS PCT: 2 % (ref 0–5)
Eosinophils Absolute: 0.1 10*3/uL (ref 0.0–0.7)
HEMATOCRIT: 38.9 % (ref 36.0–46.0)
HEMOGLOBIN: 13 g/dL (ref 12.0–15.0)
LYMPHS PCT: 41 % (ref 12–46)
Lymphs Abs: 2.9 10*3/uL (ref 0.7–4.0)
MCH: 27.7 pg (ref 26.0–34.0)
MCHC: 33.4 g/dL (ref 30.0–36.0)
MCV: 82.9 fL (ref 78.0–100.0)
MONO ABS: 0.4 10*3/uL (ref 0.1–1.0)
Monocytes Relative: 5 % (ref 3–12)
Neutro Abs: 3.7 10*3/uL (ref 1.7–7.7)
Neutrophils Relative %: 52 % (ref 43–77)
Platelets: 308 10*3/uL (ref 150–400)
RBC: 4.69 MIL/uL (ref 3.87–5.11)
RDW: 13.3 % (ref 11.5–15.5)
WBC: 7.1 10*3/uL (ref 4.0–10.5)

## 2013-05-08 LAB — POCT GLYCOSYLATED HEMOGLOBIN (HGB A1C)

## 2013-05-08 LAB — TSH: TSH: 0.611 u[IU]/mL (ref 0.350–4.500)

## 2013-05-08 LAB — GLUCOSE, POCT (MANUAL RESULT ENTRY): POC Glucose: 98 mg/dl (ref 70–99)

## 2013-05-08 NOTE — Patient Instructions (Signed)

## 2013-05-08 NOTE — Progress Notes (Signed)
Patient states here to check for DM Was seen at the specialist for other things and was told  To follow up with her primary doctor

## 2013-05-08 NOTE — Progress Notes (Signed)
MRN: [id number] Name: Briana Campbell  Sex: female Age: 38 y.o. DOB: 1975-05-03  Allergies: Review of patient's allergies indicates no known allergies.  Chief Complaint  Patient presents with  . Follow-up    HPI: Patient is 38 y.o. female who has to of impaired fasting glucose, comes today for followup, she denies any acute symptoms, today her blood sugar is 98 mg/dL but her hemoglobin A1c has trended from 5.6% to -5.9%, she denies any family history of diabetes  Past Medical History  Diagnosis Date  . Depression     PP  . History of chicken pox     Past Surgical History  Procedure Laterality Date  . Female circumcision      SKIN TAG PRESENT  . Cesarean section      x 3  . Cesarean section  03/08/2012    Procedure: CESAREAN SECTION;  Surgeon: Eldred Manges, MD;  Location: [facility];  Service: Obstetrics;  Laterality: N/A;  Bilateral Salpingectomy      Medication List       This list is accurate as of: 05/08/13  2:30 PM.  Always use your most recent med list.               ibuprofen 800 MG tablet  Commonly known as:  ADVIL,MOTRIN  TAKE 1 TABLET BY MOUTH EVERY 8 HOURS AS NEEDED FOR PAIN     loratadine 10 MG tablet  Commonly known as:  CLARITIN  Take 1 tablet (10 mg total) by mouth daily.     traMADol 50 MG tablet  Commonly known as:  ULTRAM  Take 1 tablet (50 mg total) by mouth every 8 (eight) hours as needed for pain.        No orders of the defined types were placed in this encounter.    Immunization History  Administered Date(s) Administered  . Influenza, Seasonal, Injecte, Preservative Fre 02/16/2012    Family History  Problem Relation Age of Onset  . Anesthesia problems Neg Hx   . Hypertension Mother   . Asthma Daughter     History  Substance Use Topics  . Smoking status: Never Smoker   . Smokeless tobacco: Never Used  . Alcohol Use: No    Review of Systems   As noted in HPI  Filed Vitals:   05/08/13 1413  BP: 122/82  Pulse: 74    Temp: 98.1 F (36.7 C)  Resp: 16    Physical Exam  Physical Exam  Constitutional:  Obese female sitting comfortably not in acute distress  Eyes: EOM are normal. Pupils are equal, round, and reactive to light.  Cardiovascular: Normal rate and regular rhythm.   Pulmonary/Chest: Breath sounds normal. No respiratory distress. She has no wheezes. She has no rales.    CBC    Component Value Date/Time   WBC 7.6 03/09/2012 0555   RBC 3.75* 03/09/2012 0555   HGB 11.1* 03/09/2012 0555   HCT 33.5* 03/09/2012 0555   PLT 185 03/09/2012 0555   MCV 89.3 03/09/2012 0555   LYMPHSABS 2.4 09/21/2011 1526   MONOABS 0.3 09/21/2011 1526   EOSABS 0.1 09/21/2011 1526   BASOSABS 0.0 09/21/2011 1526    CMP     Component Value Date/Time   NA 137 01/01/2013 1000   K 4.0 01/01/2013 1000   CL 99 01/01/2013 1000   CO2 30 01/01/2013 1000   GLUCOSE 126* 01/01/2013 1000   BUN 11 01/01/2013 1000   CREATININE 0.52 01/01/2013 1000   CREATININE  0.57 09/25/2009 0800   CALCIUM 9.3 01/01/2013 1000   PROT 7.5 01/01/2013 1000   ALBUMIN 4.1 01/01/2013 1000   AST 13 01/01/2013 1000   ALT 11 01/01/2013 1000   ALKPHOS 135* 01/01/2013 1000   BILITOT 0.3 01/01/2013 1000   GFRNONAA >60 09/25/2009 0800   GFRAA  Value: >60        The eGFR has been calculated using the MDRD equation. This calculation has not been validated in all clinical situations. eGFR's persistently <60 mL/min signify possible Chronic Kidney Disease. 09/25/2009 0800    Lab Results  Component Value Date/Time   CHOL 163 01/01/2013 10:00 AM    No components found with this basename: hga1c    Lab Results  Component Value Date/Time   AST 13 01/01/2013 10:00 AM    Assessment and Plan  Prediabetes/ Preventative health care - Plan: Glucose (CBG), HgB A1c Results for orders placed in visit on 05/08/13  GLUCOSE, POCT (MANUAL RESULT ENTRY)      Result Value Ref Range   POC Glucose 98  70 - 99 mg/dl  POCT GLYCOSYLATED HEMOGLOBIN (HGB A1C)      Result  Value Ref Range   Hemoglobin A1C 5.9%     I have advised patient for low carbohydrate diet and exercise. Will repeat hemoglobin A1c on the next visit if it is trending up consider starting on low-dose metformin.   Screening - Plan: MM DIGITAL SCREENING BILATERAL, Vit D  25 hydroxy (rtn osteoporosis monitoring), TSH  Menorrhagia - Plan: Will check TSH, CBC with Differential  Obesity, unspecified Diet and exercise  Health Maintenance  -Mammogram: ordered   Return in about 4 months (around 09/07/2013).  Lorayne Marek, MD

## 2013-05-09 ENCOUNTER — Telehealth: Payer: Self-pay | Admitting: *Deleted

## 2013-05-09 LAB — VITAMIN D 25 HYDROXY (VIT D DEFICIENCY, FRACTURES): Vit D, 25-Hydroxy: 36 ng/mL (ref 30–89)

## 2013-05-09 NOTE — Telephone Encounter (Signed)
Pt is aware of her lab results.  

## 2013-05-09 NOTE — Telephone Encounter (Signed)
Message copied by Raynelle CharyWINFREE, Kemp Gomes R on Thu May 09, 2013  4:22 PM ------      Message from: Doris CheadleADVANI, DEEPAK      Created: Thu May 09, 2013 10:23 AM       Call and let the patient know that her blood work is normal. ------

## 2013-05-10 ENCOUNTER — Telehealth: Payer: Self-pay

## 2013-05-10 NOTE — Telephone Encounter (Signed)
Spoke with husband He will let his wife know her labs were normal

## 2013-05-10 NOTE — Telephone Encounter (Signed)
Message copied by Lestine MountJUAREZ, Lucelia Lacey L on Fri May 10, 2013  2:19 PM ------      Message from: Doris CheadleADVANI, DEEPAK      Created: Thu May 09, 2013 10:23 AM       Call and let the patient know that her blood work is normal. ------

## 2013-08-20 ENCOUNTER — Ambulatory Visit: Payer: Medicaid Other | Admitting: Internal Medicine

## 2013-11-18 IMAGING — US US ABDOMEN COMPLETE
1 series · 14 of 25 positions shown · non-contrast
Comparison: None.

CLINICAL DATA: Abdominal distention and back pain

EXAM:
ULTRASOUND ABDOMEN COMPLETE

[Series 1: us abdomen complete · 0.25mm/px · 14 of 106 slices shown]
[im 1/106]
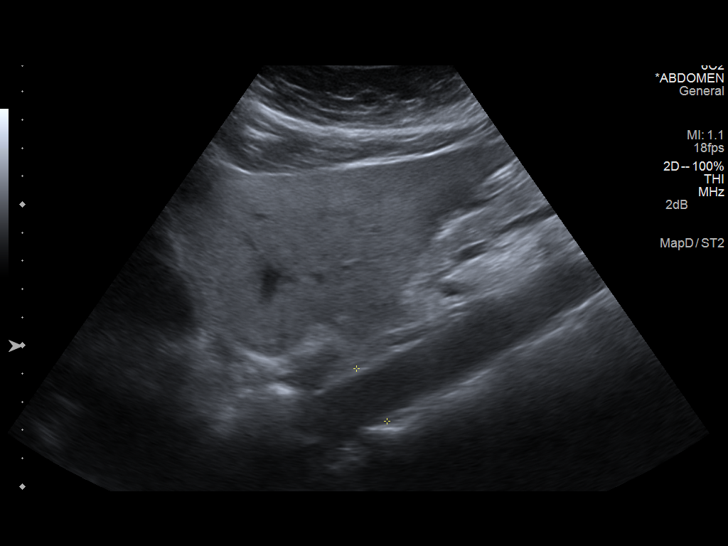
[im 9/106]
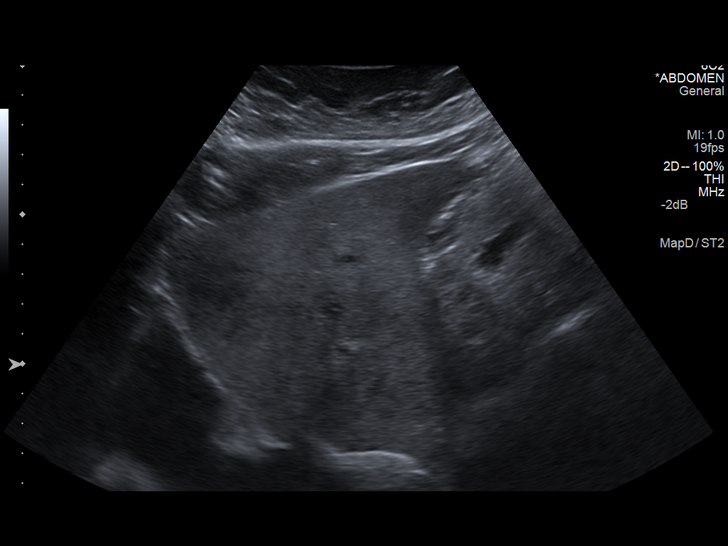
[im 18/106]
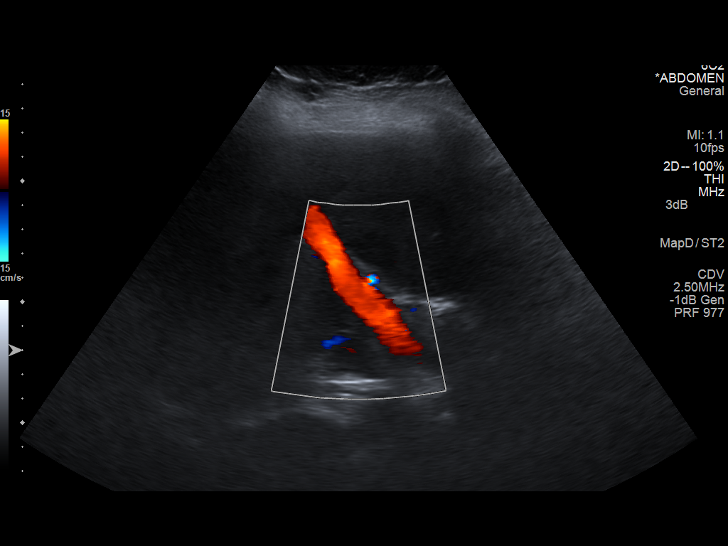
[im 27/106]
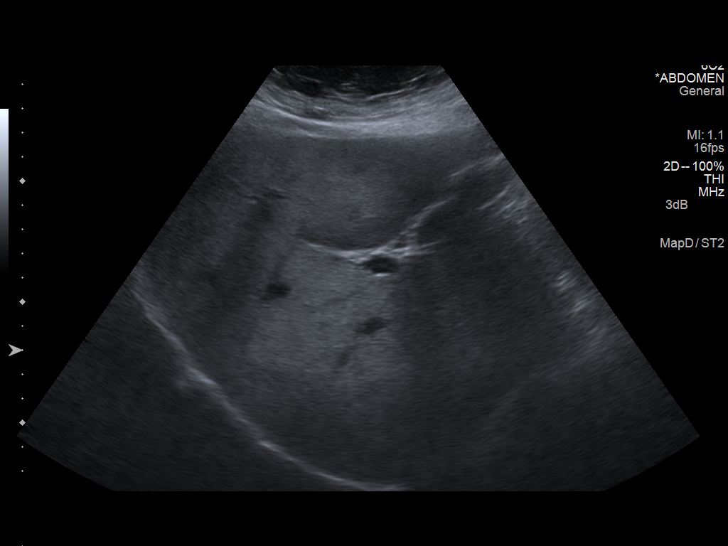
[im 36/106]
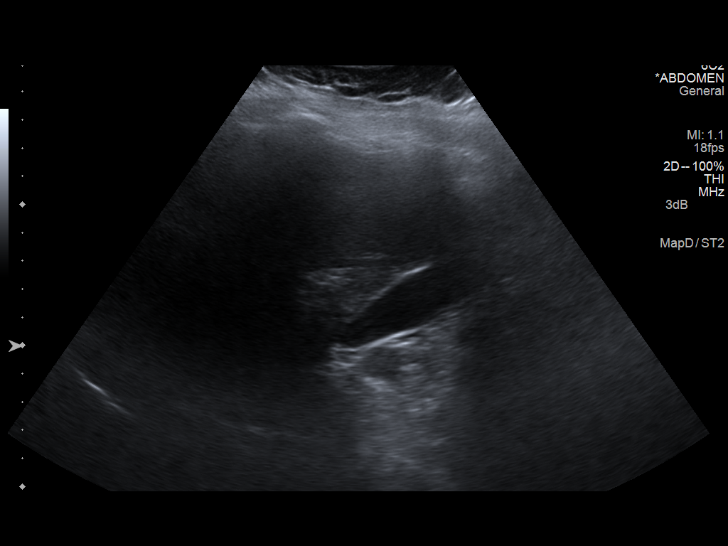
[im 40/106]
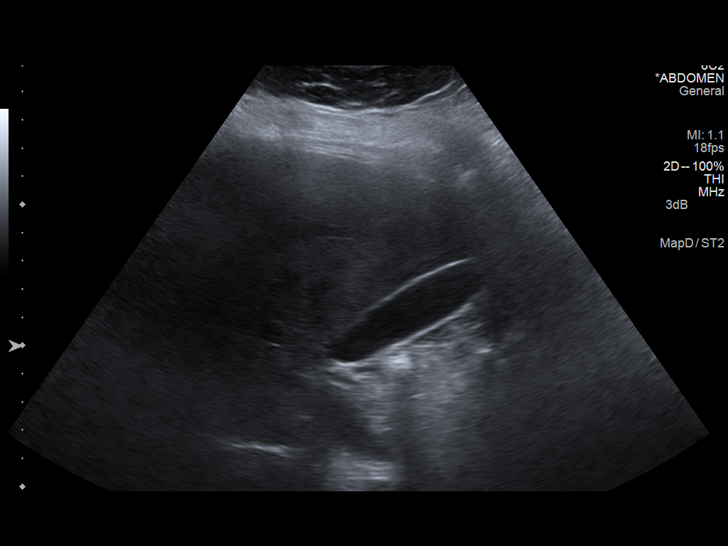
[im 49/106]
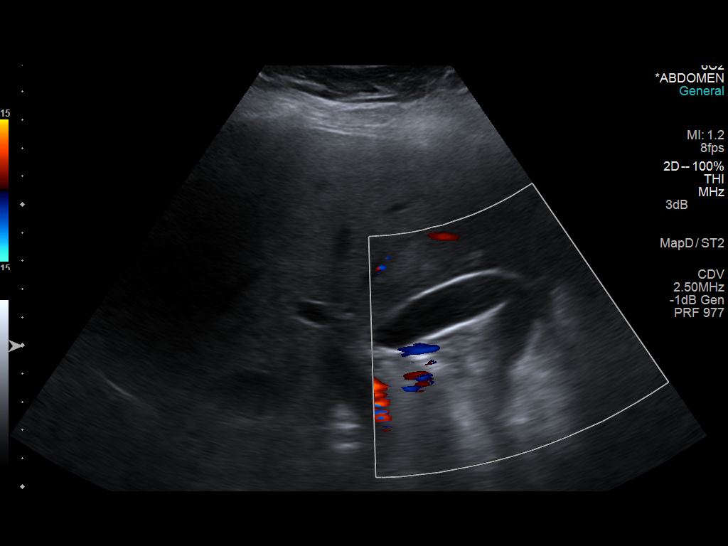
[im 57/106]
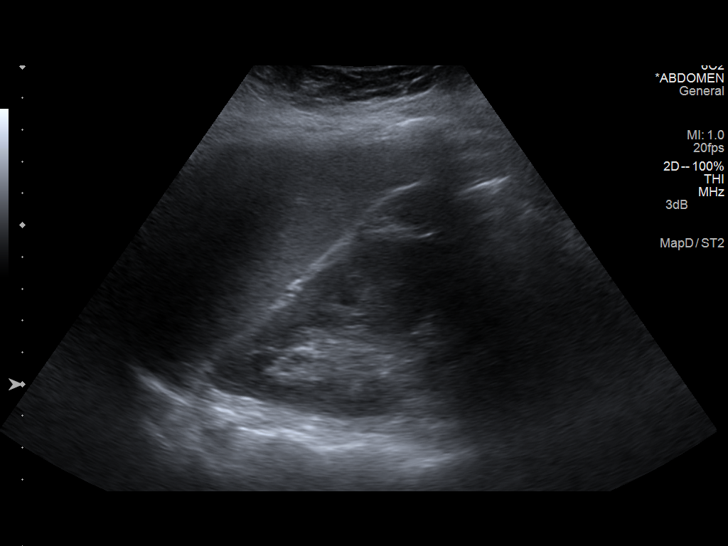
[im 66/106]
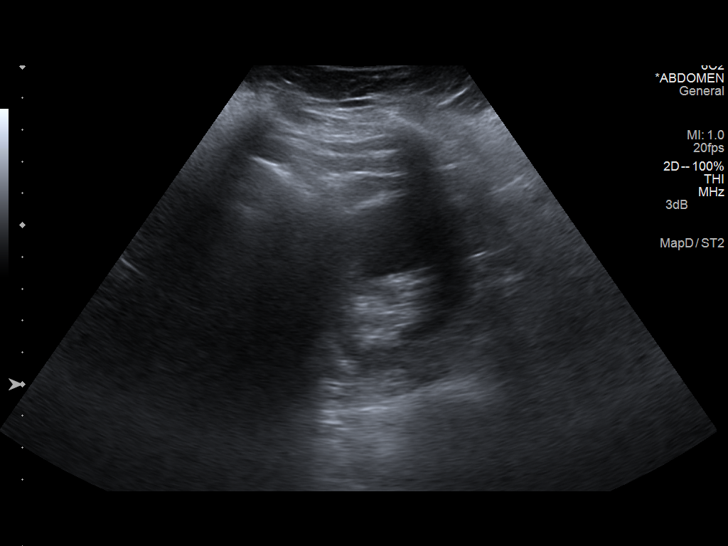
[im 71/106]
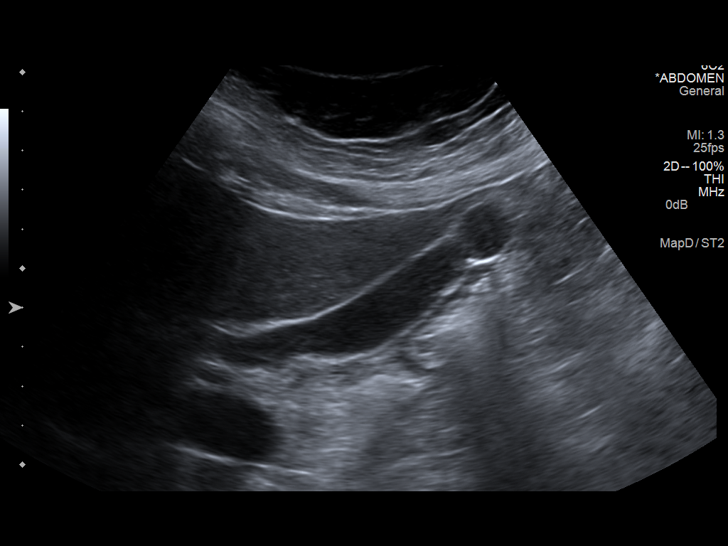
[im 79/106]
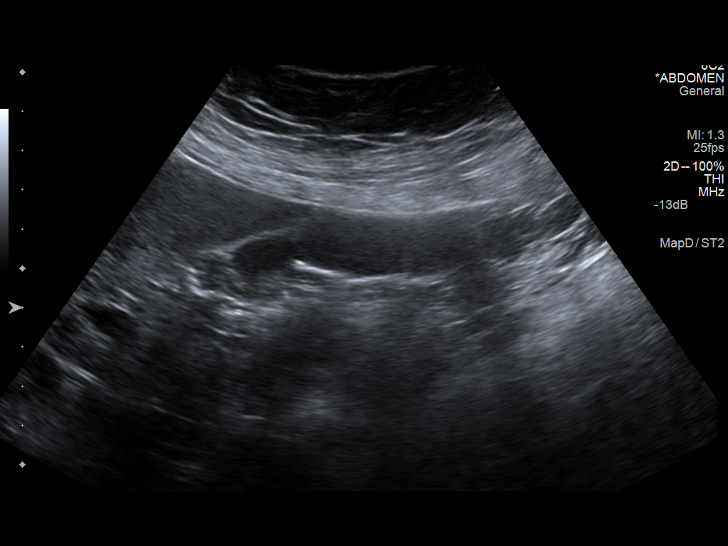
[im 88/106]
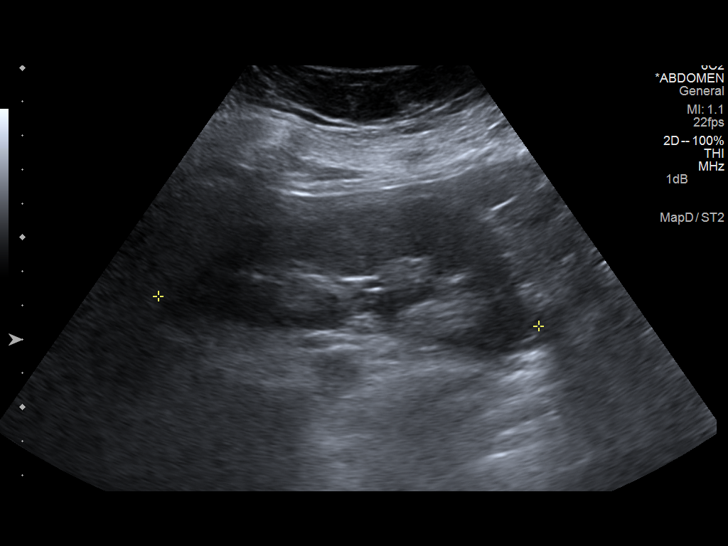
[im 97/106]
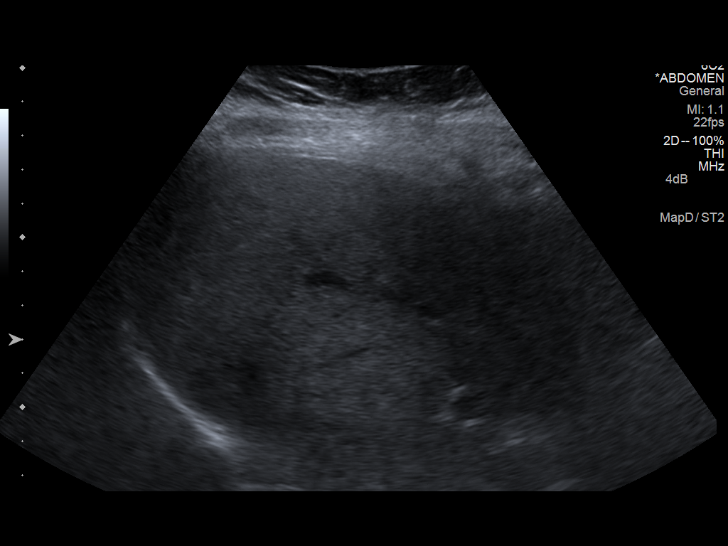
[im 106/106]
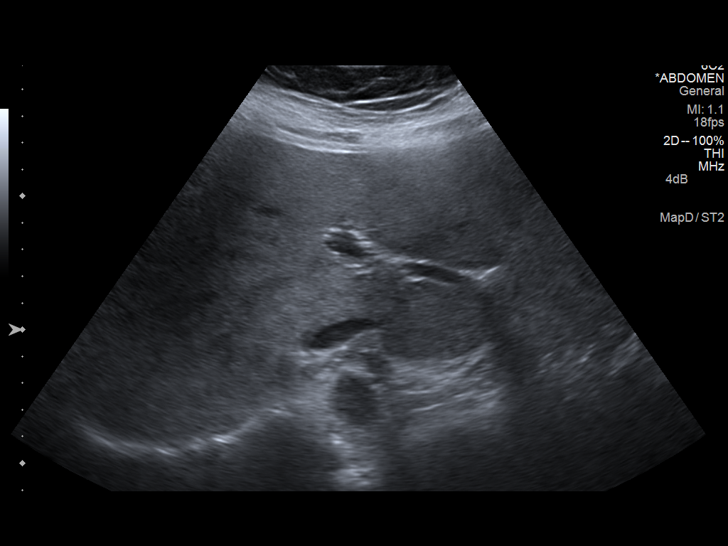

[14 of 25 positions shown; findings below may reference images not displayed]

FINDINGS: Gallbladder

No gallstones or wall thickening visualized. No sonographic Murphy
sign noted.

Common bile duct

Diameter: 2.4 mm

Liver

Diffuse increased liver echogenicity suggesting fatty infiltration.
No focal mass lesion identified.

IVC

No abnormality visualized.

Pancreas

Visualized portion unremarkable.

Spleen

Size and appearance within normal limits.

Right Kidney

Length: 11.2 cm. Echogenicity within normal limits. No mass or
hydronephrosis visualized.

Left Kidney

Length: 12.1 cm.. Echogenicity within normal limits. No mass or
hydronephrosis visualized.

Abdominal aorta

No aneurysm visualized.
IMPRESSION: 1.  Hepatic steatosis.

2. No acute findings.

## 2014-01-06 ENCOUNTER — Encounter: Payer: Self-pay | Admitting: Internal Medicine

## 2016-08-12 ENCOUNTER — Ambulatory Visit: Payer: Self-pay | Attending: Family Medicine | Admitting: Family Medicine

## 2016-08-12 VITALS — BP 105/74 | HR 76 | Temp 98.2°F | Resp 18 | Ht 61.0 in | Wt 204.2 lb

## 2016-08-12 DIAGNOSIS — M940 Chondrocostal junction syndrome [Tietze]: Secondary | ICD-10-CM | POA: Insufficient documentation

## 2016-08-12 DIAGNOSIS — M79641 Pain in right hand: Secondary | ICD-10-CM | POA: Insufficient documentation

## 2016-08-12 DIAGNOSIS — R079 Chest pain, unspecified: Secondary | ICD-10-CM | POA: Insufficient documentation

## 2016-08-12 MED ORDER — IBUPROFEN 600 MG PO TABS: 600.0000 mg | ORAL_TABLET | Freq: Three times a day (TID) | ORAL | 0 refills | Status: DC | PRN

## 2016-08-12 MED ORDER — METHOCARBAMOL 500 MG PO TABS: 500.0000 mg | ORAL_TABLET | Freq: Three times a day (TID) | ORAL | 0 refills | Status: DC | PRN

## 2016-08-12 NOTE — Progress Notes (Signed)
Subjective:  Patient ID: Briana Campbell, female    DOB: [birth year]  Age: 41 y.o. MRN: [id number]  CC: Chest Pain  Interpreter services used: Saif # [id number]   HPI Briana Campbell presents for right sided shoulder and right sided upper costal pain. Symptoms began 2 weeks ago and have persisted. She denies left sided CP, any radiating pain to the jaw or arm,  SOB, cough, hemoptysis, N/V, or weakness. She also reports right hand pain. Denies any history of injury or paresthesias. Symptoms aggravated by reaching and movement. She does reports recent frequent lifting and housework.    Outpatient Medications Prior to Visit  Medication Sig Dispense Refill  . loratadine (CLARITIN) 10 MG tablet Take 1 tablet (10 mg total) by mouth daily. (Patient not taking: Reported on 08/12/2016) 15 tablet 0  . traMADol (ULTRAM) 50 MG tablet Take 1 tablet (50 mg total) by mouth every 8 (eight) hours as needed for pain. (Patient not taking: Reported on 08/12/2016) 65 tablet 1  . ibuprofen (ADVIL,MOTRIN) 800 MG tablet TAKE 1 TABLET BY MOUTH EVERY 8 HOURS AS NEEDED FOR PAIN (Patient not taking: Reported on 08/12/2016) 50 tablet 0   No facility-administered medications prior to visit.     ROS Review of Systems  Constitutional: Negative.   Respiratory: Negative.   Cardiovascular: Negative.   Gastrointestinal: Negative.   Musculoskeletal: Positive for myalgias.  Skin: Negative.   Neurological: Negative.    Objective:  BP 105/74 (BP Location: Left Arm, Patient Position: Sitting, Cuff Size: Normal)   Pulse 76   Temp 98.2 F (36.8 C) (Oral)   Resp 18   Ht 5\' 1"  (1.549 m)   Wt 204 lb 3.2 oz (92.6 kg)   SpO2 99%   BMI 38.58 kg/m   BP/Weight 08/12/2016 05/08/2013 02/19/2013  Systolic BP 105 122 102  Diastolic BP 74 82 73  Wt. (Lbs) 204.2 217.6 214  BMI 38.58 38.56 37.92     Physical Exam  Constitutional: She is oriented to person, place, and time. She appears well-developed and well-nourished.  HENT:  Head:  Normocephalic and atraumatic.  Right Ear: External ear normal.  Left Ear: External ear normal.  Nose: Nose normal.  Mouth/Throat: Oropharynx is clear and moist.  Eyes: Conjunctivae are normal. Pupils are equal, round, and reactive to light.  Neck: No JVD present.  Cardiovascular: Normal rate, regular rhythm, normal heart sounds and intact distal pulses.   No murmur heard. Pulmonary/Chest: Effort normal and breath sounds normal.  Abdominal: Soft. Bowel sounds are normal.  Musculoskeletal:  Right sided upper costal pain. Reproducible with palpation.   Neurological: She is alert and oriented to person, place, and time.  Skin: Skin is warm and dry.  Nursing note and vitals reviewed.  Assessment & Plan:   Problem List Items Addressed This Visit    None    Visit Diagnoses    Costochondritis, acute    -  Primary   Relevant Medications   methocarbamol (ROBAXIN) 500 MG tablet   ibuprofen (ADVIL,MOTRIN) 600 MG tablet      Meds ordered this encounter  Medications  . methocarbamol (ROBAXIN) 500 MG tablet    Sig: Take 1 tablet (500 mg total) by mouth every 8 (eight) hours as needed for muscle spasms.    Dispense:  30 tablet    Refill:  0    Order Specific Question:   Supervising Provider    Answer:   Quentin Angst [id number]  . ibuprofen (ADVIL,MOTRIN) 600 MG tablet  Sig: Take 1 tablet (600 mg total) by mouth every 8 (eight) hours as needed.    Dispense:  30 tablet    Refill:  0    Order Specific Question:   Supervising Provider    Answer:   Quentin AngstJEGEDE, OLUGBEMIGA E [id number]    Follow-up: Return in about 2 weeks (around 08/26/2016) for PAP.   Lizbeth BarkMandesia R Lashae Wollenberg FNP

## 2016-08-12 NOTE — Progress Notes (Signed)
Patient is here for chest pain when breathing

## 2016-08-12 NOTE — Patient Instructions (Signed)
Costochondritis Costochondritis is swelling and irritation (inflammation) of the tissue (cartilage) that connects your ribs to your breastbone (sternum). This causes pain in the front of your chest. Usually, the pain:  Starts gradually.  Is in more than one rib.  This condition usually goes away on its own over time. Follow these instructions at home:  Do not do anything that makes your pain worse.  If directed, put ice on the painful area: ? Put ice in a plastic bag. ? Place a towel between your skin and the bag. ? Leave the ice on for 20 minutes, 2-3 times a day.  If directed, put heat on the affected area as often as told by your doctor. Use the heat source that your doctor tells you to use, such as a moist heat pack or a heating pad. ? Place a towel between your skin and the heat source. ? Leave the heat on for 20-30 minutes. ? Take off the heat if your skin turns bright red. This is very important if you cannot feel pain, heat, or cold. You may have a greater risk of getting burned.  Take over-the-counter and prescription medicines only as told by your doctor.  Return to your normal activities as told by your doctor. Ask your doctor what activities are safe for you.  Keep all follow-up visits as told by your doctor. This is important. Contact a doctor if:  You have chills or a fever.  Your pain does not go away or it gets worse.  You have a cough that does not go away. Get help right away if:  You are short of breath. This information is not intended to replace advice given to you by your health care provider. Make sure you discuss any questions you have with your health care provider. Document Released: 08/10/2007 Document Revised: 09/11/2015 Document Reviewed: 06/17/2015 Elsevier Interactive Patient Education  2018 Elsevier Inc.  

## 2016-09-01 ENCOUNTER — Other Ambulatory Visit: Payer: Self-pay | Admitting: Family Medicine

## 2016-09-16 ENCOUNTER — Ambulatory Visit: Payer: Medicaid Other | Attending: Family Medicine

## 2016-12-07 ENCOUNTER — Ambulatory Visit: Payer: Self-pay | Attending: Family Medicine

## 2017-03-23 ENCOUNTER — Ambulatory Visit: Payer: Self-pay | Attending: Family Medicine | Admitting: Physician Assistant

## 2017-03-23 VITALS — BP 108/75 | HR 72 | Temp 98.0°F | Resp 16 | Ht 62.0 in | Wt 206.2 lb

## 2017-03-23 DIAGNOSIS — R202 Paresthesia of skin: Secondary | ICD-10-CM | POA: Insufficient documentation

## 2017-03-23 DIAGNOSIS — Z13220 Encounter for screening for lipoid disorders: Secondary | ICD-10-CM | POA: Insufficient documentation

## 2017-03-23 NOTE — Progress Notes (Signed)
Patient stated she is here for the numbness on both of her shoulder and hands. Patient stated two month ago, she had pain on her left shoulder. Patient would like to get blood work done.

## 2017-03-23 NOTE — Progress Notes (Signed)
Patient ID: Briana Campbell, female   DOB: 04/05/1975, 42 y.o.   MRN: [id number]     Briana Campbell, is a 42 y.o. female  [id number]  [id number]  DOB - 01/15/1976  Subjective:  Chief Complaint and HPI: Briana Campbell is a 42 y.o. female here today for C/o intermittent arm numbness. This has been going on now for about 6-8 months.  It occurs in both arms.  No CP/diaphoresis/SOB.  No FH early cardiac demise.  Non-smoker.  Occurs sometimes in the daytime and sometimes at night.  No neck pain.  No neck injury.  Ibuprofen helps some.  No alleviating or precipitating factors. She wants to have her cholesterol checked.  Husband is translating  ROS:   Constitutional:  No f/c, No night sweats, No unexplained weight loss. EENT:  No vision changes, No blurry vision, No hearing changes. No mouth, throat, or ear problems.  Respiratory: No cough, No SOB Cardiac: No CP, no palpitations GI:  No abd pain, No N/V/D. GU: No Urinary s/sx Musculoskeletal: No joint pain Neuro: No headache, no dizziness, no motor weakness.  Skin: No rash Endocrine:  No polydipsia. No polyuria.  Psych: Denies SI/HI  No problems updated.  ALLERGIES: No Known Allergies  PAST MEDICAL HISTORY: Past Medical History:  Diagnosis Date  . Depression    PP  . History of chicken pox     MEDICATIONS AT HOME: Prior to Admission medications   Medication Sig Start Date End Date Taking? Authorizing Provider  loratadine (CLARITIN) 10 MG tablet Take 1 tablet (10 mg total) by mouth daily. Patient not taking: Reported on 08/12/2016 02/19/13   Dhungel, Theda BelfastNishant, MD     Objective:  EXAM:   Vitals:   03/23/17 1108  BP: 108/75  Pulse: 72  Resp: 16  Temp: 98 F (36.7 C)  TempSrc: Oral  SpO2: 100%  Weight: 206 lb 3.2 oz (93.5 kg)  Height: 5\' 2"  (1.575 m)    General appearance : A&OX3. NAD. Non-toxic-appearing HEENT: Atraumatic and Normocephalic.  PERRLA. EOM intact.  TM clear B. Mouth-MMM, post pharynx WNL w/o  erythema, No PND. Neck: supple, no JVD. No cervical lymphadenopathy. No thyromegaly Chest/Lungs:  Breathing-non-labored, Good air entry bilaterally, breath sounds normal without rales, rhonchi, or wheezing  CVS: S1 S2 regular, no murmurs, gallops, rubs  Neck WNL UE DTR= intact B.  Strength is normal and symmetrical. Extremities: Bilateral Lower Ext shows no edema, both legs are warm to touch with = pulse throughout Neurology:  CN II-XII grossly intact, Non focal.   Psych:  TP linear. J/I WNL. Normal speech. Appropriate eye contact and affect.  Skin:  No Rash  Data Review Lab Results  Component Value Date   HGBA1C 5.9% 05/08/2013   HGBA1C 5.6 02/19/2013   HGBA1C 5.4 10/18/2011     Assessment & Plan   1. Paresthesias BUE.  She is not interested in medication at this time.  No red flags that this is cervical or cardiac in nature.   - TSH - Vitamin D, 25-hydroxy - Basic metabolic panel  2. Screening cholesterol level - Lipid Panel   Patient have been counseled extensively about nutrition and exercise  Return in about 3 months (around 06/21/2017) [facility]; recheck paresthesias.  The patient was given clear instructions to go to ER or return to medical center if symptoms don't improve, worsen or new problems develop. The patient verbalized understanding. The patient was told to call to get lab results if they haven't heard anything in the next  week.     Georgian Co, PA-C [facility] and [facility] [place], [place] [phone]   03/23/2017, 11:28 AM

## 2017-03-24 LAB — BASIC METABOLIC PANEL
BUN/Creatinine Ratio: 16 (ref 9–23)
BUN: 9 mg/dL (ref 6–24)
CO2: 25 mmol/L (ref 20–29)
Calcium: 9 mg/dL (ref 8.7–10.2)
Chloride: 101 mmol/L (ref 96–106)
Creatinine, Ser: 0.56 mg/dL — ABNORMAL LOW (ref 0.57–1.00)
GFR calc Af Amer: 131 mL/min/{1.73_m2} (ref >59)
GFR calc non Af Amer: 114 mL/min/{1.73_m2} (ref >59)
Glucose: 116 mg/dL — ABNORMAL HIGH (ref 65–99)
POTASSIUM: 3.9 mmol/L (ref 3.5–5.2)
Sodium: 140 mmol/L (ref 134–144)

## 2017-03-24 LAB — TSH: TSH: 0.787 u[IU]/mL (ref 0.450–4.500)

## 2017-03-24 LAB — LIPID PANEL
CHOL/HDL RATIO: 3.5 ratio (ref 0.0–4.4)
Cholesterol, Total: 164 mg/dL (ref 100–199)
HDL: 47 mg/dL (ref >39)
LDL Calculated: 82 mg/dL (ref 0–99)
Triglycerides: 176 mg/dL — ABNORMAL HIGH (ref 0–149)
VLDL CHOLESTEROL CAL: 35 mg/dL (ref 5–40)

## 2017-03-24 LAB — VITAMIN D 25 HYDROXY (VIT D DEFICIENCY, FRACTURES): Vit D, 25-Hydroxy: 18.3 ng/mL — ABNORMAL LOW (ref 30.0–100.0)

## 2017-03-27 ENCOUNTER — Other Ambulatory Visit: Payer: Self-pay | Admitting: Physician Assistant

## 2017-03-27 MED ORDER — VITAMIN D (ERGOCALCIFEROL) 1.25 MG (50000 UNIT) PO CAPS: 50000.0000 [IU] | ORAL_CAPSULE | ORAL | 0 refills | Status: DC

## 2017-03-29 ENCOUNTER — Telehealth: Payer: Self-pay

## 2017-03-29 NOTE — Telephone Encounter (Signed)
CMA called patient regarding lab result.  Patient's son informed on lab result and PCP advising.   Patient's son is aware to pick up Rx at the pharmacy.

## 2017-03-29 NOTE — Telephone Encounter (Signed)
-----   Message from Anders SimmondsAngela M McClung, [place] JerseyPA-C sent at 03/27/2017  6:42 AM EST ----- Please call patient and let them that their vitamin D is low.  This can contribute to muscle aches, anxiety, fatigue, and depression.  I have sent a prescription to the pharmacy for them to take once a week.  We will recheck this level in 3-4 months.  Her thyroid was normal.  Her triglycerides and blood sugar were a little elevated.  She should cut down on sugar and fatty food intake and this should improve.  Her kidney function and electrolytes were good. Thanks, Georgian CoAngela McClung, PA-C

## 2017-05-04 ENCOUNTER — Other Ambulatory Visit: Payer: Self-pay

## 2017-05-04 ENCOUNTER — Ambulatory Visit: Payer: Self-pay | Attending: Internal Medicine | Admitting: Physician Assistant

## 2017-05-04 VITALS — BP 108/75 | HR 64 | Temp 98.4°F | Resp 16 | Wt 204.8 lb

## 2017-05-04 DIAGNOSIS — B354 Tinea corporis: Secondary | ICD-10-CM | POA: Insufficient documentation

## 2017-05-04 DIAGNOSIS — E559 Vitamin D deficiency, unspecified: Secondary | ICD-10-CM | POA: Insufficient documentation

## 2017-05-04 DIAGNOSIS — Z789 Other specified health status: Secondary | ICD-10-CM

## 2017-05-04 MED ORDER — VITAMIN D (ERGOCALCIFEROL) 1.25 MG (50000 UNIT) PO CAPS: 50000.0000 [IU] | ORAL_CAPSULE | ORAL | 0 refills | Status: AC

## 2017-05-04 MED ORDER — CLOTRIMAZOLE-BETAMETHASONE 1-0.05 % EX LOTN: TOPICAL_LOTION | Freq: Two times a day (BID) | CUTANEOUS | 1 refills | Status: DC

## 2017-05-04 NOTE — Progress Notes (Signed)
Concerns with skin discoloration on right forearm. Itching x 1 month intermittently.

## 2017-05-04 NOTE — Progress Notes (Signed)
Patient ID: Briana Campbell, female   DOB: 01/01/1976, 42 y.o.   MRN: [id number]   Briana Campbell, is a 42 y.o. female  [id number]  [id number]  DOB - [birth year]  Subjective:  Chief Complaint and HPI: Briana Campbell is a 42 y.o. female here today with a rash on her R volar forearm she is calling eczema.  She used some of her child's eczema cream which helped with itching but the rash did not go away.  No new soaps, laundry, detergents.  No f/c.  [place] with [employer] interpreters translating   ROS:   Constitutional:  No f/c, No night sweats, No unexplained weight loss. EENT:  No vision changes, No blurry vision, No hearing changes. No mouth, throat, or ear problems.  Respiratory: No cough, No SOB Cardiac: No CP, no palpitations GI:  No abd pain, No N/V/D. GU: No Urinary s/sx Musculoskeletal: No joint pain Neuro: No headache, no dizziness, no motor weakness.  Skin: + rash Endocrine:  No polydipsia. No polyuria.  Psych: Denies SI/HI  No problems updated.  ALLERGIES: No Known Allergies  PAST MEDICAL HISTORY: Past Medical History:  Diagnosis Date  . Depression    PP  . History of chicken pox     MEDICATIONS AT HOME: Prior to Admission medications   Medication Sig Start Date End Date Taking? Authorizing Provider  clotrimazole-betamethasone (LOTRISONE) lotion Apply topically 2 (two) times daily. X 3 weeks then prn 05/04/17   Anders Simmonds, PA-C  loratadine (CLARITIN) 10 MG tablet Take 1 tablet (10 mg total) by mouth daily. Patient not taking: Reported on 08/12/2016 02/19/13   Dhungel, Theda Belfast, MD  Vitamin D, Ergocalciferol, (DRISDOL) 50000 units CAPS capsule Take 1 capsule (50,000 Units total) by mouth every 7 (seven) days. 05/04/17   Anders Simmonds, PA-C     Objective:  EXAM:   Vitals:   05/04/17 0841  BP: 108/75  Pulse: 64  Resp: 16  Temp: 98.4 F (36.9 C)  TempSrc: Oral  SpO2: 98%  Weight: 204 lb 12.8 oz (92.9 kg)    General appearance : A&OX3. NAD.  Non-toxic-appearing HEENT: Atraumatic and Normocephalic.  PERRLA. EOM intact.   Neck: supple, no JVD. No cervical lymphadenopathy. No thyromegaly Chest/Lungs:  Breathing-non-labored, Good air entry bilaterally, breath sounds normal without rales, rhonchi, or wheezing  CVS: S1 S2 regular, no murmurs, gallops, rubs  Extremities: Bilateral Lower Ext shows no edema, both legs are warm to touch with = pulse throughout R mid volar forearm with irregular but well demarcated border.  Slightly raised and has scale and small satellite lesions.  No secondary infection.  Neurology:  CN II-XII grossly intact, Non focal.   Psych:  TP linear. J/I WNL. Normal speech. Appropriate eye contact and affect.  Skin:  R mid volar forearm with irregular but well demarcated border.  Slightly raised and has scale and small satellite lesions.  No secondary infection.    Data Review Lab Results  Component Value Date   HGBA1C 5.9% 05/08/2013   HGBA1C 5.6 02/19/2013   HGBA1C 5.4 10/18/2011     Assessment & Plan   1. Ringworm of body lotrisone bid X 3 weeks.  Educational information given.    2. Language barrier [employer] interpreters used and additional time performing visit was required.   Patient have been counseled extensively about nutrition and exercise  Return in about 3 months (around 08/01/2017) for assign new PCP; follow-up vitamin D deficiency.  The patient was given clear instructions to go to ER or return  to medical center if symptoms don't improve, worsen or new problems develop. The patient verbalized understanding. The patient was told to call to get lab results if they haven't heard anything in the next week.     Georgian CoAngela Clorene Nerio, PA-C [facility] [place], [place] [phone]   05/04/2017, 8:53 AM

## 2017-05-04 NOTE — Patient Instructions (Signed)

## 2017-05-05 MED FILL — CLOTRIM-BETAMETH LOT 1-.05%: 1-0.05 | 21 days supply | Qty: 30 | Fill #0

## 2017-05-05 MED FILL — VIT D2 1.25 MG (50,000 UNIT: 1.25 MG | 28 days supply | Qty: 4 | Fill #0

## 2017-06-13 MED FILL — VIT D2 1.25 MG (50,000 UNIT: 1.25 MG | 28 days supply | Qty: 4 | Fill #1

## 2017-07-17 MED FILL — VIT D2 1.25 MG (50,000 UNIT: 1.25 MG | 28 days supply | Qty: 4 | Fill #2

## 2017-07-28 ENCOUNTER — Ambulatory Visit: Payer: Self-pay | Attending: Family Medicine | Admitting: Family Medicine

## 2017-07-28 VITALS — BP 127/81 | HR 73 | Temp 98.6°F | Resp 16 | Ht 62.0 in | Wt 204.6 lb

## 2017-07-28 DIAGNOSIS — M79605 Pain in left leg: Secondary | ICD-10-CM | POA: Insufficient documentation

## 2017-07-28 DIAGNOSIS — M5441 Lumbago with sciatica, right side: Secondary | ICD-10-CM | POA: Insufficient documentation

## 2017-07-28 DIAGNOSIS — E559 Vitamin D deficiency, unspecified: Secondary | ICD-10-CM

## 2017-07-28 DIAGNOSIS — B359 Dermatophytosis, unspecified: Secondary | ICD-10-CM

## 2017-07-28 DIAGNOSIS — M5442 Lumbago with sciatica, left side: Secondary | ICD-10-CM | POA: Insufficient documentation

## 2017-07-28 DIAGNOSIS — R21 Rash and other nonspecific skin eruption: Secondary | ICD-10-CM | POA: Insufficient documentation

## 2017-07-28 DIAGNOSIS — M5417 Radiculopathy, lumbosacral region: Secondary | ICD-10-CM

## 2017-07-28 DIAGNOSIS — Z760 Encounter for issue of repeat prescription: Secondary | ICD-10-CM | POA: Insufficient documentation

## 2017-07-28 DIAGNOSIS — M5431 Sciatica, right side: Secondary | ICD-10-CM

## 2017-07-28 DIAGNOSIS — E669 Obesity, unspecified: Secondary | ICD-10-CM | POA: Insufficient documentation

## 2017-07-28 DIAGNOSIS — M5432 Sciatica, left side: Secondary | ICD-10-CM

## 2017-07-28 DIAGNOSIS — M79604 Pain in right leg: Secondary | ICD-10-CM | POA: Insufficient documentation

## 2017-07-28 MED ORDER — MELOXICAM 15 MG PO TABS: 15.0000 mg | ORAL_TABLET | Freq: Every day | ORAL | 0 refills | Status: DC

## 2017-07-28 MED ORDER — CYCLOBENZAPRINE HCL 5 MG PO TABS: 5.0000 mg | ORAL_TABLET | Freq: Two times a day (BID) | ORAL | 0 refills | Status: DC

## 2017-07-28 MED ORDER — NYSTATIN 100000 UNIT/GM EX CREA: 1.0000 "application " | TOPICAL_CREAM | Freq: Two times a day (BID) | CUTANEOUS | 0 refills | Status: DC

## 2017-07-28 MED FILL — NYSTATIN 100,000 UNIT/GM CR: 100000 | 30 days supply | Qty: 60 | Fill #0

## 2017-07-28 MED FILL — CYCLOBENZAPRINE 5 MG TABLET: 5 | 15 days supply | Qty: 30 | Fill #0

## 2017-07-28 MED FILL — MELOXICAM 15 MG TABLET: 15 | 30 days supply | Qty: 30 | Fill #0

## 2017-07-28 NOTE — Patient Instructions (Signed)
Return in 2 weeks if no improvement with current treatment plan.    Sciatica Sciatica is pain, numbness, weakness, or tingling along your sciatic nerve. The sciatic nerve starts in the lower back and goes down the back of each leg. Sciatica happens when this nerve is pinched or has pressure put on it. Sciatica usually goes away on its own or with treatment. Sometimes, sciatica may keep coming back (recur). Follow these instructions at home: Medicines  Take over-the-counter and prescription medicines only as told by your doctor.  Do not drive or use heavy machinery while taking prescription pain medicine. Managing pain  If directed, put ice on the affected area. ? Put ice in a plastic bag. ? Place a towel between your skin and the bag. ? Leave the ice on for 20 minutes, 2-3 times a day.  After icing, apply heat to the affected area before you exercise or as often as told by your doctor. Use the heat source that your doctor tells you to use, such as a moist heat pack or a heating pad. ? Place a towel between your skin and the heat source. ? Leave the heat on for 20-30 minutes. ? Remove the heat if your skin turns bright red. This is especially important if you are unable to feel pain, heat, or cold. You may have a greater risk of getting burned. Activity  Return to your normal activities as told by your doctor. Ask your doctor what activities are safe for you. ? Avoid activities that make your sciatica worse.  Take short rests during the day. Rest in a lying or standing position. This is usually better than sitting to rest. ? When you rest for a long time, do some physical activity or stretching between periods of rest. ? Avoid sitting for a long time without moving. Get up and move around at least one time each hour.  Exercise and stretch regularly, as told by your doctor.  Do not lift anything that is heavier than 10 lb (4.5 kg) while you have symptoms of sciatica. ? Avoid lifting  heavy things even when you do not have symptoms. ? Avoid lifting heavy things over and over.  When you lift objects, always lift in a way that is safe for your body. To do this, you should: ? Bend your knees. ? Keep the object close to your body. ? Avoid twisting. General instructions  Use good posture. ? Avoid leaning forward when you are sitting. ? Avoid hunching over when you are standing.  Stay at a healthy weight.  Wear comfortable shoes that support your feet. Avoid wearing high heels.  Avoid sleeping on a mattress that is too soft or too hard. You might have less pain if you sleep on a mattress that is firm enough to support your back.  Keep all follow-up visits as told by your doctor. This is important. Contact a doctor if:  You have pain that: ? Wakes you up when you are sleeping. ? Gets worse when you lie down. ? Is worse than the pain you have had in the past. ? Lasts longer than 4 weeks.  You lose weight for without trying. Get help right away if:  You cannot control when you pee (urinate) or poop (have a bowel movement).  You have weakness in any of these areas and it gets worse. ? Lower back. ? Lower belly (pelvis). ? Butt (buttocks). ? Legs.  You have redness or swelling of your back.  You  have a burning feeling when you pee. This information is not intended to replace advice given to you by your health care provider. Make sure you discuss any questions you have with your health care provider. Document Released: 12/01/2007 Document Revised: 07/30/2015 Document Reviewed: 10/31/2014 Elsevier Interactive Patient Education  [employer].

## 2017-07-28 NOTE — Progress Notes (Signed)
Pt. Is here for both leg pain and back pain.  Pt. Stated the pain hurts when she sit down and get up with movements. Pt. Stated it has been going for months but lately it have stop.  "feels like throbbing pain and feels in bone." per patient.

## 2017-07-28 NOTE — Progress Notes (Signed)
Patient ID: Briana Campbell, female    DOB: 10-06-1975, 42 y.o.   MRN: [id number]  PCP: No primary care provider on file.  Chief Complaint  Patient presents with  . Back Pain  . Leg Pain  . Medication Refill    Subjective:  HPI Briana Campbell is a 42 y.o. female presents for evaluation of back pain with associated leg pain and right forearm rash.   Back Pain Complains of three weeks of back pain. This problem has occurred in the past. She denies any known recent or prior injury. Reports associated bilateral leg pain that radiates from her back. The pain is positional and exacerbated with sitting or lying. Pain improves with ambulation. She has attempted relief with ibuprofen which has improved back pain temporarily. She denies any caude equina symptoms or numbness or tingling of extremities.   Rash Reports after a recent beach trip family develop ring worm. Patient had previously been treated for a similar type rash with Lotrisone cream, however has used all of cream. Complains of associated itching and dryness. Rash is localized only to right forearm and no similar lesions are present elsewhere on the body.  Social History   Socioeconomic History  . Marital status: Married    Spouse name: [name]  . Number of children: Not on file  . Years of education: HS  . Highest education level: Not on file  Occupational History  . Occupation: HOMEMAKER  Social Needs  . Financial resource strain: Not on file  . Food insecurity:    Worry: Not on file    Inability: Not on file  . Transportation needs:    Medical: Not on file    Non-medical: Not on file  Tobacco Use  . Smoking status: Never Smoker  . Smokeless tobacco: Never Used  Substance and Sexual Activity  . Alcohol use: No  . Drug use: No  . Sexual activity: Yes    Comment: BTL  Lifestyle  . Physical activity:    Days per week: Not on file    Minutes per session: Not on file  . Stress: Not on file  Relationships   . Social connections:    Talks on phone: Not on file    Gets together: Not on file    Attends religious service: Not on file    Active member of club or organization: Not on file    Attends meetings of clubs or organizations: Not on file    Relationship status: Not on file  . Intimate partner violence:    Fear of current or ex partner: Not on file    Emotionally abused: Not on file    Physically abused: Not on file    Forced sexual activity: Not on file  Other Topics Concern  . Not on file  Social History Narrative  . Not on file    Family History  Problem Relation Age of Onset  . Hypertension Mother   . Asthma Daughter   . Anesthesia problems Neg Hx    Review of Systems Pertinent negatives listed in HPI  Patient Active Problem List   Diagnosis Date Noted  . H/O bilateral salpingectomy 04/17/2012  . Status post repeat low transverse cesarean section 03/09/2012  . Twin gestation, dichorionic diamniotic 10/18/2011  . Late prenatal care 09/21/2011  . AMA (advanced maternal age) multigravida 35+ 09/21/2011  . 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 Obesity 09/21/2011  . Hx gestational diabetes 09/21/2011  . History of cesarean section 09/18/2011  No Known Allergies  Prior to Admission medications   Medication Sig Start Date End Date Taking? Authorizing Provider  clotrimazole-betamethasone (LOTRISONE) lotion Apply topically 2 (two) times daily. X 3 weeks then prn 05/04/17  Yes McClung, Angela M, PA-C  Vitamin D, Ergocalciferol, (DRISDOL) 50000 units CAPS capsule Take 1 capsule (50,000 Units total) by mouth every 7 (seven) days. 05/04/17  Yes Georgian Co M, PA-C  loratadine (CLARITIN) 10 MG tablet Take 1 tablet (10 mg total) by mouth daily. Patient not taking: Reported on 08/12/2016 02/19/13   Eddie North, MD    Past Medical, Surgical Family and Social History reviewed and updated.    Objective:   Today's Vitals   07/28/17 1604  BP: 127/81  Pulse: 73  Resp: 16   Temp: 98.6 F (37 C)  TempSrc: Oral  SpO2: 96%  Weight: 204 lb 9.6 oz (92.8 kg)  Height:  (1.575 m)  PainSc: 9     Wt Readings from Last 3 Encounters:  07/28/17 204 lb 9.6 oz (92.8 kg)  05/04/17 204 lb 12.8 oz (92.9 kg)  03/23/17 206 lb 3.2 oz (93.5 kg)   Physical Exam  Constitutional: She is oriented to person, place, and time. She appears well-developed and well-nourished.  HENT:  Head: Normocephalic and atraumatic.  Neck: Normal range of motion.  Cardiovascular: Normal rate, regular rhythm, normal heart sounds and intact distal pulses.  Pulmonary/Chest: Effort normal and breath sounds normal.  Musculoskeletal: Normal range of motion.  Neurological: She is alert and oriented to person, place, and time. She displays normal reflexes. She exhibits normal muscle tone. Coordination normal.  Skin: Rash noted. Rash is maculopapular. There is erythema.  Fine satellite type lesion present around the macular surface of the rash  Psychiatric: She has a normal mood and affect. Her behavior is normal. Judgment and thought content normal.   Assessment & Plan:  1. Lumbosacral radiculopathy 2. Bilateral sciatica  Patient is currently fasting for Ramadan  therefore opted against prednisone for now. Patient agreed to trial a combination of Meloxicam and Cyclobenzaprine for two weeks. If no improvement she will follow-up and opt to start a prednisone taper.  3. Vitamin D deficiency, prescribed vitamin D replacement approximately 3 months ago. Will recheck vitamin D level today. If remains low, will resume VITAMIN D replacement therapy.   4. Tinea, right forearm suspected. Will trial a nystatin cream twice daily to affected area.    RTC: Back pain 2 weeks.    Godfrey Pick. Tiburcio Pea, MSN, [facility]  [address] [place], [place], [place] [address] [phone]

## 2017-07-29 LAB — VITAMIN D 25 HYDROXY (VIT D DEFICIENCY, FRACTURES): VIT D 25 HYDROXY: 23.5 ng/mL — AB (ref 30.0–100.0)

## 2017-07-31 ENCOUNTER — Telehealth: Payer: Self-pay | Admitting: Family Medicine

## 2017-07-31 MED ORDER — ERGOCALCIFEROL 1.25 MG (50000 UT) PO CAPS: 50000.0000 [IU] | ORAL_CAPSULE | ORAL | 1 refills | Status: AC

## 2017-07-31 NOTE — Telephone Encounter (Signed)
Vitamin D remains low. I will order another 12 weeks of oral vitamin D therapy. Medication sent to [facility].

## 2017-08-01 ENCOUNTER — Ambulatory Visit: Payer: Medicaid Other | Admitting: Nurse Practitioner

## 2017-08-01 ENCOUNTER — Telehealth: Payer: Self-pay

## 2017-08-01 NOTE — Telephone Encounter (Signed)
CMA attempted to call patient to inform on lab results. No answer and left  VM for patient to call back.

## 2017-08-01 NOTE — Telephone Encounter (Signed)
CMA attempt to call patient to inform on lab results. No answer and left a VM for patient to call back.  If patient call back, please inform:  Vitamin D remains low. I will order another 12 weeks of oral vitamin D therapy. Medication sent to [facility].

## 2017-08-14 ENCOUNTER — Ambulatory Visit: Payer: Medicaid Other | Admitting: Nurse Practitioner

## 2017-08-15 ENCOUNTER — Encounter: Payer: Self-pay | Admitting: Nurse Practitioner

## 2017-08-15 ENCOUNTER — Ambulatory Visit: Payer: Self-pay | Attending: Nurse Practitioner | Admitting: Nurse Practitioner

## 2017-08-15 VITALS — BP 107/75 | HR 82 | Temp 98.8°F | Ht 62.0 in | Wt 203.8 lb

## 2017-08-15 DIAGNOSIS — Z791 Long term (current) use of non-steroidal anti-inflammatories (NSAID): Secondary | ICD-10-CM | POA: Insufficient documentation

## 2017-08-15 DIAGNOSIS — J020 Streptococcal pharyngitis: Secondary | ICD-10-CM | POA: Insufficient documentation

## 2017-08-15 DIAGNOSIS — Z8249 Family history of ischemic heart disease and other diseases of the circulatory system: Secondary | ICD-10-CM | POA: Insufficient documentation

## 2017-08-15 DIAGNOSIS — Z79899 Other long term (current) drug therapy: Secondary | ICD-10-CM | POA: Insufficient documentation

## 2017-08-15 DIAGNOSIS — M25562 Pain in left knee: Secondary | ICD-10-CM | POA: Insufficient documentation

## 2017-08-15 DIAGNOSIS — E559 Vitamin D deficiency, unspecified: Secondary | ICD-10-CM | POA: Insufficient documentation

## 2017-08-15 DIAGNOSIS — J029 Acute pharyngitis, unspecified: Secondary | ICD-10-CM

## 2017-08-15 LAB — POCT RAPID STREP A (OFFICE): RAPID STREP A SCREEN: POSITIVE — AB

## 2017-08-15 MED ORDER — PENICILLIN V POTASSIUM 500 MG PO TABS: 500.0000 mg | ORAL_TABLET | Freq: Two times a day (BID) | ORAL | 0 refills | Status: AC

## 2017-08-15 MED ORDER — IBUPROFEN 800 MG PO TABS: 800.0000 mg | ORAL_TABLET | Freq: Three times a day (TID) | ORAL | 0 refills | Status: AC | PRN

## 2017-08-15 MED ORDER — MELOXICAM 15 MG PO TABS: 15.0000 mg | ORAL_TABLET | Freq: Every day | ORAL | 1 refills | Status: AC

## 2017-08-15 MED ORDER — CYCLOBENZAPRINE HCL 5 MG PO TABS: 5.0000 mg | ORAL_TABLET | Freq: Two times a day (BID) | ORAL | 1 refills | Status: AC | PRN

## 2017-08-15 MED FILL — CYCLOBENZAPRINE 5 MG TABLET: 5 | 30 days supply | Qty: 60 | Fill #0

## 2017-08-15 MED FILL — PENICILLIN VK 500 MG TABLET: 500 | 10 days supply | Qty: 20 | Fill #0

## 2017-08-15 MED FILL — IBUPROFEN 800 MG TABLET: 800 | 20 days supply | Qty: 60 | Fill #0

## 2017-08-15 NOTE — Patient Instructions (Addendum)
Arthritis Arthritis means joint pain. It can also mean joint disease. A joint is a place where bones come together. People who have arthritis may have:  Red joints.  Swollen joints.  Stiff joints.  Warm joints.  A fever.  A feeling of being sick.  Follow these instructions at home: Pay attention to any changes in your symptoms. Take these actions to help with your pain and swelling. Medicines  Take over-the-counter and prescription medicines only as told by your doctor.  Do not take aspirin for pain if your doctor says that you may have gout. Activity  Rest your joint if your doctor tells you to.  Avoid activities that make the pain worse.  Exercise your joint regularly as told by your doctor. Try doing exercises like: ? Swimming. ? Water aerobics. ? Biking. ? Walking. Joint Care   If your joint is swollen, keep it raised (elevated) if told by your doctor.  If your joint feels stiff in the morning, try taking a warm shower.  If you have diabetes, do not apply heat without asking your doctor.  If told, apply heat to the joint: ? Put a towel between the joint and the hot pack or heating pad. ? Leave the heat on the area for 20-30 minutes.  If told, apply ice to the joint: ? Put ice in a plastic bag. ? Place a towel between your skin and the bag. ? Leave the ice on for 20 minutes, 2-3 times per day.  Keep all follow-up visits as told by your doctor. Contact a doctor if:  The pain gets worse.  You have a fever. Get help right away if:  You have very bad pain in your joint.  You have swelling in your joint.  Your joint is red.  Many joints become painful and swollen.  You have very bad back pain.  Your leg is very weak.  You cannot control your pee (urine) or poop (stool). This information is not intended to replace advice given to you by your health care provider. Make sure you discuss any questions you have with your health care provider. Document  Released: 05/18/2009 Document Revised: 07/30/2015 Document Reviewed: 05/19/2014 Elsevier Interactive Patient Education  2018 [employer]  Sore Throat A sore throat is pain, burning, irritation, or scratchiness in the throat. When you have a sore throat, you may feel pain or tenderness in your throat when you swallow or talk. Many things can cause a sore throat, including:  An infection.  Seasonal allergies.  Dryness in the air.  Irritants, such as smoke or pollution.  Gastroesophageal reflux disease (GERD).  A tumor.  A sore throat is often the first sign of another sickness. It may happen with other symptoms, such as coughing, sneezing, fever, and swollen neck glands. Most sore throats go away without medical treatment. Follow these instructions at home:  Take over-the-counter medicines only as told by your health care provider.  Drink enough fluids to keep your urine clear or pale yellow.  Rest as needed.  To help with pain, try: ? Sipping warm liquids, such as broth, herbal tea, or warm water. ? Eating or drinking cold or frozen liquids, such as frozen ice pops. ? Gargling with a salt-water mixture 3-4 times a day or as needed. To make a salt-water mixture, completely dissolve -1 tsp of salt in 1 cup of warm water. ? Sucking on hard candy or throat lozenges. ? Putting a cool-mist humidifier in your bedroom at night to  moisten the air. ? Sitting in the bathroom with the door closed for 5-10 minutes while you run hot water in the shower.  Do not use any tobacco products, such as cigarettes, chewing tobacco, and e-cigarettes. If you need help quitting, ask your health care provider. Contact a health care provider if:  You have a fever for more than 2-3 days.  You have symptoms that last (are persistent) for more than 2-3 days.  Your throat does not get better within 7 days.  You have a fever and your symptoms suddenly get worse. Get help right away if:  You have  difficulty breathing.  You cannot swallow fluids, soft foods, or your saliva.  You have increased swelling in your throat or neck.  You have persistent nausea and vomiting. This information is not intended to replace advice given to you by your health care provider. Make sure you discuss any questions you have with your health care provider. Document Released: 03/31/2004 Document Revised: 10/18/2015 Document Reviewed: 12/12/2014 Elsevier Interactive Patient Education  2018 [employer]  Sciatica Sciatica is pain, numbness, weakness, or tingling along your sciatic nerve. The sciatic nerve starts in the lower back and goes down the back of each leg. Sciatica happens when this nerve is pinched or has pressure put on it. Sciatica usually goes away on its own or with treatment. Sometimes, sciatica may keep coming back (recur). Follow these instructions at home: Medicines  Take over-the-counter and prescription medicines only as told by your doctor.  Do not drive or use heavy machinery while taking prescription pain medicine. Managing pain  If directed, put ice on the affected area. ? Put ice in a plastic bag. ? Place a towel between your skin and the bag. ? Leave the ice on for 20 minutes, 2-3 times a day.  After icing, apply heat to the affected area before you exercise or as often as told by your doctor. Use the heat source that your doctor tells you to use, such as a moist heat pack or a heating pad. ? Place a towel between your skin and the heat source. ? Leave the heat on for 20-30 minutes. ? Remove the heat if your skin turns bright red. This is especially important if you are unable to feel pain, heat, or cold. You may have a greater risk of getting burned. Activity  Return to your normal activities as told by your doctor. Ask your doctor what activities are safe for you. ? Avoid activities that make your sciatica worse.  Take short rests during the day. Rest in a lying or  standing position. This is usually better than sitting to rest. ? When you rest for a long time, do some physical activity or stretching between periods of rest. ? Avoid sitting for a long time without moving. Get up and move around at least one time each hour.  Exercise and stretch regularly, as told by your doctor.  Do not lift anything that is heavier than 10 lb (4.5 kg) while you have symptoms of sciatica. ? Avoid lifting heavy things even when you do not have symptoms. ? Avoid lifting heavy things over and over.  When you lift objects, always lift in a way that is safe for your body. To do this, you should: ? Bend your knees. ? Keep the object close to your body. ? Avoid twisting. General instructions  Use good posture. ? Avoid leaning forward when you are sitting. ? Avoid hunching over when you are  standing.  Stay at a healthy weight.  Wear comfortable shoes that support your feet. Avoid wearing high heels.  Avoid sleeping on a mattress that is too soft or too hard. You might have less pain if you sleep on a mattress that is firm enough to support your back.  Keep all follow-up visits as told by your doctor. This is important. Contact a doctor if:  You have pain that: ? Wakes you up when you are sleeping. ? Gets worse when you lie down. ? Is worse than the pain you have had in the past. ? Lasts longer than 4 weeks.  You lose weight for without trying. Get help right away if:  You cannot control when you pee (urinate) or poop (have a bowel movement).  You have weakness in any of these areas and it gets worse. ? Lower back. ? Lower belly (pelvis). ? Butt (buttocks). ? Legs.  You have redness or swelling of your back.  You have a burning feeling when you pee. This information is not intended to replace advice given to you by your health care provider. Make sure you discuss any questions you have with your health care provider. Document Released: 12/01/2007  Document Revised: 07/30/2015 Document Reviewed: 10/31/2014 Elsevier Interactive Patient Education  2018 [employer]  Strep Throat Strep throat is a bacterial infection of the throat. Your health care provider may call the infection tonsillitis or pharyngitis, depending on whether there is swelling in the tonsils or at the back of the throat. Strep throat is most common during the cold months of the year in children who are 45-23 years of age, but it can happen during any season in people of any age. This infection is spread from person to person (contagious) through coughing, sneezing, or close contact. What are the causes? Strep throat is caused by the bacteria called Streptococcus pyogenes. What increases the risk? This condition is more likely to develop in:  People who spend time in crowded places where the infection can spread easily.  People who have close contact with someone who has strep throat.  What are the signs or symptoms? Symptoms of this condition include:  Fever or chills.  Redness, swelling, or pain in the tonsils or throat.  Pain or difficulty when swallowing.  White or yellow spots on the tonsils or throat.  Swollen, tender glands in the neck or under the jaw.  Red rash all over the body (rare).  How is this diagnosed? This condition is diagnosed by performing a rapid strep test or by taking a swab of your throat (throat culture test). Results from a rapid strep test are usually ready in a few minutes, but throat culture test results are available after one or two days. How is this treated? This condition is treated with antibiotic medicine. Follow these instructions at home: Medicines  Take over-the-counter and prescription medicines only as told by your health care provider.  Take your antibiotic as told by your health care provider. Do not stop taking the antibiotic even if you start to feel better.  Have family members who also have a sore throat or  fever tested for strep throat. They may need antibiotics if they have the strep infection. Eating and drinking  Do not share food, drinking cups, or personal items that could cause the infection to spread to other people.  If swallowing is difficult, try eating soft foods until your sore throat feels better.  Drink enough fluid to keep your urine  clear or pale yellow. General instructions  Gargle with a salt-water mixture 3-4 times per day or as needed. To make a salt-water mixture, completely dissolve -1 tsp of salt in 1 cup of warm water.  Make sure that all household members wash their hands well.  Get plenty of rest.  Stay home from school or work until you have been taking antibiotics for 24 hours.  Keep all follow-up visits as told by your health care provider. This is important. Contact a health care provider if:  The glands in your neck continue to get bigger.  You develop a rash, cough, or earache.  You cough up a thick liquid that is green, yellow-brown, or bloody.  You have pain or discomfort that does not get better with medicine.  Your problems seem to be getting worse rather than better.  You have a fever. Get help right away if:  You have new symptoms, such as vomiting, severe headache, stiff or painful neck, chest pain, or shortness of breath.  You have severe throat pain, drooling, or changes in your voice.  You have swelling of the neck, or the skin on the neck becomes red and tender.  You have signs of dehydration, such as fatigue, dry mouth, and decreased urination.  You become increasingly sleepy, or you cannot wake up completely.  Your joints become red or painful. This information is not intended to replace advice given to you by your health care provider. Make sure you discuss any questions you have with your health care provider. Document Released: 02/19/2000 Document Revised: 10/21/2015 Document Reviewed: 06/16/2014 Elsevier Interactive  Patient Education  [employer].

## 2017-08-15 NOTE — Progress Notes (Signed)
Assessment & Plan:  Briana Campbell was seen today for establish care and sore throat.  Diagnoses and all orders for this visit:  Strep throat -     ibuprofen (ADVIL,MOTRIN) 800 MG tablet; Take 1 tablet (800 mg total) by mouth every 8 (eight) hours as needed. DO NOT TAKE ON THE DAYS YOU TAKE MOBIC FOR BACK PAIN -     penicillin v potassium (VEETID) 500 MG tablet; Take 1 tablet (500 mg total) by mouth 2 (two) times daily for 10 days.  Acute pain of left knee -     meloxicam (MOBIC) 15 MG tablet; Take 1 tablet (15 mg total) by mouth daily. -     cyclobenzaprine (FLEXERIL) 5 MG tablet; Take 1 tablet (5 mg total) by mouth 2 (two) times daily as needed for muscle spasms. Recommended weight loss and exercise. Work on losing weight to help reduce back pain. May alternate with heat and ice application for pain relief. May also alternate with acetaminophen for pain. You must stay active and avoid a sedentary lifestyle.   Sore throat -     Rapid Strep A    Patient has been counseled on age-appropriate routine health concerns for screening and prevention. These are reviewed and up-to-date. Referrals have been placed accordingly. Immunizations are up-to-date or declined.    Subjective:   Chief Complaint  Patient presents with  . Establish Care    Pt. is here to establish care. Pt. is here for follow-up on her Vitamin D.   . Sore Throat    Pt. stated her throat feels swollen and when she spit there were blood, and her left knee is givng her pain.    HPI Briana Campbell 42 y.o. female presents to office today to establish care. She does not have much by the way of PMH. She does have a history of vitamin d deficiency however her last vitamin D level was drawn 07-28-2017 and was 23.5 so she does not need lab testing for that today. VRI was used to communicate directly with patient for the entire encounter including providing detailed patient instructions.   Sore Throat Patient complains of sore throat.  Symptoms began 1 day ago. Pain is of severe severity. Fever is absent. Other associated symptoms have included "internal fever".  Fluid intake is poor. There is dysphagia with food and liquids.   There has not been contact with an individual with known strep.  Current medications include olive oil and ibuprofen with no relief of pain.    Knee Pain Patient presents with knee pain involving the  left knee. Onset of the symptoms was several months ago. Inciting event: none known. Current symptoms include stiffness. Pain is aggravated by rising after sitting but immediately goes away after a few minutes once she begins walking.  Patient has had prior knee pain in both knees Evaluation to date: none. Treatment to date: none.  She is able to bear full weight.    Review of Systems  Constitutional: Positive for fever. Negative for malaise/fatigue and weight loss.  HENT: Positive for sore throat. Negative for nosebleeds.   Eyes: Negative.  Negative for blurred vision, double vision and photophobia.  Respiratory: Negative.  Negative for cough and shortness of breath.   Cardiovascular: Negative.  Negative for chest pain, palpitations and leg swelling.  Gastrointestinal: Negative.  Negative for heartburn, nausea and vomiting.  Musculoskeletal: Positive for joint pain. Negative for myalgias.  Neurological: Negative.  Negative for dizziness, focal weakness, seizures and headaches.  Psychiatric/Behavioral: Negative.  Negative for suicidal ideas.    Past Medical History:  Diagnosis Date  . History of chicken pox     Past Surgical History:  Procedure Laterality Date  . CESAREAN SECTION     x 3  . CESAREAN SECTION  03/08/2012   Procedure: CESAREAN SECTION;  Surgeon: Hal Morales, MD;  Location: [facility];  Service: Obstetrics;  Laterality: N/A;  Bilateral Salpingectomy  . FEMALE CIRCUMCISION     SKIN TAG PRESENT  . TUBAL LIGATION      Family History  Problem Relation Age of Onset  . Hypertension  Mother   . Asthma Daughter   . Anesthesia problems Neg Hx     Social History Reviewed with no changes to be made today.   Outpatient Medications Prior to Visit  Medication Sig Dispense Refill  . ergocalciferol (VITAMIN D2) 50000 units capsule Take 1 capsule (50,000 Units total) by mouth once a week. 12 capsule 1  . Vitamin D, Ergocalciferol, (DRISDOL) 50000 units CAPS capsule Take 1 capsule (50,000 Units total) by mouth every 7 (seven) days. 16 capsule 0  . cyclobenzaprine (FLEXERIL) 5 MG tablet Take 1-2 tablets (5-10 mg total) by mouth 2 (two) times daily. 30 tablet 0  . meloxicam (MOBIC) 15 MG tablet Take 1 tablet (15 mg total) by mouth daily. 30 tablet 0  . nystatin cream (MYCOSTATIN) Apply 1 application topically 2 (two) times daily. 60 g 0  . loratadine (CLARITIN) 10 MG tablet Take 1 tablet (10 mg total) by mouth daily. (Patient not taking: Reported on 08/12/2016) 15 tablet 0  . clotrimazole-betamethasone (LOTRISONE) lotion Apply topically 2 (two) times daily. X 3 weeks then prn (Patient not taking: Reported on 08/15/2017) 30 mL 1   No facility-administered medications prior to visit.     No Known Allergies     Objective:    BP 107/75 (BP Location: Right Arm, Patient Position: Sitting, Cuff Size: Large)   Pulse 82   Temp 98.8 F (37.1 C) (Oral)   Ht 5\' 2"  (1.575 m)   Wt 203 lb 12.8 oz (92.4 kg)   LMP 08/08/2017   SpO2 98%   BMI 37.28 kg/m  Wt Readings from Last 3 Encounters:  08/15/17 203 lb 12.8 oz (92.4 kg)  07/28/17 204 lb 9.6 oz (92.8 kg)  05/04/17 204 lb 12.8 oz (92.9 kg)    Physical Exam  Constitutional: She is oriented to person, place, and time. She appears well-developed and well-nourished. She is cooperative.  HENT:  Head: Normocephalic and atraumatic.  Right Ear: Hearing normal.  Left Ear: Hearing normal.  Mouth/Throat: No oral lesions. No uvula swelling. Oropharyngeal exudate, posterior oropharyngeal edema and posterior oropharyngeal erythema present. No  tonsillar abscesses.  Eyes: EOM are normal.  Neck: Normal range of motion.  Cardiovascular: Normal rate, regular rhythm, normal heart sounds and intact distal pulses. Exam reveals no gallop and no friction rub.  No murmur heard. Pulmonary/Chest: Effort normal and breath sounds normal. No tachypnea. No respiratory distress. She has no decreased breath sounds. She has no wheezes. She has no rhonchi. She has no rales. She exhibits no tenderness.  Abdominal: Soft. Bowel sounds are normal.  Musculoskeletal: Normal range of motion. She exhibits no edema.       Left knee: Tenderness found. Medial joint line tenderness noted.  Neurological: She is alert and oriented to person, place, and time. Coordination normal.  Skin: Skin is warm and dry.  Psychiatric: She has a normal mood and affect. Her  behavior is normal. Judgment and thought content normal.  Nursing note and vitals reviewed.      Patient has been counseled extensively about nutrition and exercise as well as the importance of adherence with medications and regular follow-up. The patient was given clear instructions to go to ER or return to medical center if symptoms don't improve, worsen or new problems develop. The patient verbalized understanding.   Follow-up: Return in about 4 months (around 12/15/2017) for f/u vitamin d.   Claiborne Rigg, FNP-BC [facility] and [facility] [place], [place] [phone]   08/15/2017, 11:59 AM

## 2018-01-08 ENCOUNTER — Ambulatory Visit: Payer: Medicaid Other | Admitting: Nurse Practitioner
